# Patient Record
Sex: Male | Born: 1975 | Race: White | Hispanic: No | Marital: Single | Smoking: Former smoker
Health system: Southern US, Community
[De-identification: ages and names within clinical notes are randomized; demographics above are authoritative.]

## PROBLEM LIST (undated history)

## (undated) DIAGNOSIS — F209 Schizophrenia, unspecified: Secondary | ICD-10-CM

## (undated) DIAGNOSIS — J189 Pneumonia, unspecified organism: Secondary | ICD-10-CM

## (undated) HISTORY — PX: NO PAST SURGERIES: SHX2092

---

## 1997-05-15 ENCOUNTER — Encounter: Admission: RE | Admit: 1997-05-15 | Discharge: 1997-05-15 | Payer: Self-pay | Admitting: Internal Medicine

## 2002-07-27 ENCOUNTER — Inpatient Hospital Stay (HOSPITAL_COMMUNITY): Admission: EM | Admit: 2002-07-27 | Discharge: 2002-08-01 | Payer: Self-pay | Admitting: Psychiatry

## 2003-10-29 ENCOUNTER — Emergency Department (HOSPITAL_COMMUNITY): Admission: EM | Admit: 2003-10-29 | Discharge: 2003-10-29 | Payer: Self-pay | Admitting: Emergency Medicine

## 2004-03-02 ENCOUNTER — Inpatient Hospital Stay (HOSPITAL_COMMUNITY): Admission: RE | Admit: 2004-03-02 | Discharge: 2004-03-08 | Payer: Self-pay | Admitting: Psychiatry

## 2004-03-02 ENCOUNTER — Ambulatory Visit: Payer: Self-pay | Admitting: Psychiatry

## 2004-03-06 ENCOUNTER — Ambulatory Visit (HOSPITAL_COMMUNITY): Admission: RE | Admit: 2004-03-06 | Discharge: 2004-03-06 | Payer: Self-pay | Admitting: Family Medicine

## 2004-05-17 ENCOUNTER — Inpatient Hospital Stay (HOSPITAL_COMMUNITY): Admission: EM | Admit: 2004-05-17 | Discharge: 2004-05-20 | Payer: Self-pay | Admitting: Emergency Medicine

## 2004-09-29 ENCOUNTER — Emergency Department (HOSPITAL_COMMUNITY): Admission: EM | Admit: 2004-09-29 | Discharge: 2004-09-29 | Payer: Self-pay | Admitting: Emergency Medicine

## 2010-01-27 ENCOUNTER — Encounter: Payer: Self-pay | Admitting: Psychiatry

## 2013-03-29 DIAGNOSIS — F331 Major depressive disorder, recurrent, moderate: Secondary | ICD-10-CM | POA: Diagnosis not present

## 2013-04-06 DIAGNOSIS — F3289 Other specified depressive episodes: Secondary | ICD-10-CM | POA: Diagnosis not present

## 2013-04-06 DIAGNOSIS — F329 Major depressive disorder, single episode, unspecified: Secondary | ICD-10-CM | POA: Diagnosis not present

## 2013-05-04 DIAGNOSIS — F3289 Other specified depressive episodes: Secondary | ICD-10-CM | POA: Diagnosis not present

## 2013-05-04 DIAGNOSIS — F329 Major depressive disorder, single episode, unspecified: Secondary | ICD-10-CM | POA: Diagnosis not present

## 2014-06-28 ENCOUNTER — Encounter: Payer: Self-pay | Admitting: *Deleted

## 2014-06-28 NOTE — Progress Notes (Signed)
Oncology Nurse Navigator Documentation  Oncology Nurse Navigator Flowsheets 06/28/2014  Navigator Encounter Type Other/Phone call   Treatment Phase Abnormal Scans/Patient called and had some concerns about his treatment. He is coming 07/06/14 for a second opinion.  I called back and spoke with his wife.  I helped educate her on SCLC.  She had some general questions.  I answered all questions and concerns .    Time Spent with Patient 30

## 2015-01-18 ENCOUNTER — Encounter (HOSPITAL_COMMUNITY): Payer: Self-pay | Admitting: Emergency Medicine

## 2015-01-18 ENCOUNTER — Emergency Department (INDEPENDENT_AMBULATORY_CARE_PROVIDER_SITE_OTHER)
Admission: EM | Admit: 2015-01-18 | Discharge: 2015-01-18 | Disposition: A | Discharge status: Home / Self Care | Payer: Medicare Other | Source: Home / Self Care | Attending: Family Medicine | Admitting: Family Medicine

## 2015-01-18 DIAGNOSIS — J9801 Acute bronchospasm: Secondary | ICD-10-CM | POA: Diagnosis not present

## 2015-01-18 DIAGNOSIS — J069 Acute upper respiratory infection, unspecified: Secondary | ICD-10-CM | POA: Diagnosis not present

## 2015-01-18 DIAGNOSIS — Z720 Tobacco use: Secondary | ICD-10-CM | POA: Diagnosis not present

## 2015-01-18 MED ORDER — ALBUTEROL SULFATE HFA 108 (90 BASE) MCG/ACT IN AERS: 2.0000 | INHALATION_SPRAY | RESPIRATORY_TRACT | Status: DC | PRN

## 2015-01-18 MED ORDER — PREDNISONE 20 MG PO TABS: ORAL_TABLET | ORAL | Status: DC

## 2015-01-18 NOTE — ED Provider Notes (Signed)
CSN: [id number]     Arrival date & time 01/18/15  1832 History   First MD Initiated Contact with Patient 01/18/15 1932     No chief complaint on file.  (Consider location/radiation/quality/duration/timing/severity/associated sxs/prior Treatment) HPI Comments: 40 year old male complaining of nasal congestion and runny nose and may be a cough. He does not remember. Denies PND, earache or fever. He smokes. He cannot remember how much.   No past medical history on file. No past surgical history on file. No family history on file. Social History  Substance Use Topics  . Smoking status: Not on file  . Smokeless tobacco: Not on file  . Alcohol Use: Not on file    Review of Systems  Constitutional: Negative for fever, diaphoresis, activity change and fatigue.  HENT: Positive for congestion, rhinorrhea and voice change. Negative for ear pain, facial swelling, postnasal drip, sore throat and trouble swallowing.   Eyes: Negative for pain, discharge and redness.  Respiratory: Positive for cough. Negative for chest tightness and shortness of breath.   Cardiovascular: Negative.   Gastrointestinal: Negative.   Musculoskeletal: Negative.  Negative for neck pain and neck stiffness.  Neurological: Negative.   All other systems reviewed and are negative.   Allergies  Review of patient's allergies indicates not on file.  Home Medications   Prior to Admission medications   Medication Sig Start Date End Date Taking? Authorizing Provider  albuterol (PROVENTIL HFA;VENTOLIN HFA) 108 (90 Base) MCG/ACT inhaler Inhale 2 puffs into the lungs every 4 (four) hours as needed for wheezing or shortness of breath. 01/18/15   Hayden Rasmussen, NP  predniSONE (DELTASONE) 20 MG tablet Take 3 tabs po on first day, 2 tabs second day, 2 tabs third day, 1 tab fourth day, 1 tab 5th day. Take with food. 01/18/15   Hayden Rasmussen, NP   Meds Ordered and Administered this Visit  Medications - No data to display  BP 148/89 mmHg   Pulse 103  Temp(Src) 97.8 F (36.6 C) (Oral)  Resp 16  SpO2 99% No data found.   Physical Exam  Constitutional: He is oriented to person, place, and time. He appears well-developed and well-nourished. No distress.  HENT:  Mouth/Throat: No oropharyngeal exudate.  Bilateral TMs are occluded by cerumen and debris. No drainage or moisture. Oropharynx with copious amount of clear PND, minor erythema and cobblestoning.  Eyes: Conjunctivae and EOM are normal.  Neck: Normal range of motion. Neck supple.  Cardiovascular: Normal rate, regular rhythm and normal heart sounds.   Pulmonary/Chest: Effort normal. No respiratory distress. He has wheezes.  Musculoskeletal: Normal range of motion. He exhibits no edema.  Lymphadenopathy:    He has no cervical adenopathy.  Neurological: He is alert and oriented to person, place, and time.  Skin: Skin is warm and dry. No rash noted.  Psychiatric: He has a normal mood and affect.  Nursing note and vitals reviewed.   ED Course  Procedures (including critical care time)  Labs Review Labs Reviewed - No data to display  Imaging Review No results found.   Visual Acuity Review  Right Eye Distance:   Left Eye Distance:   Bilateral Distance:    Right Eye Near:   Left Eye Near:    Bilateral Near:         MDM   1. URI (upper respiratory infection)   2. Tobacco abuse disorder   3. Bronchospasm    For drainage may take Zyrtec, Claritin or Allegra. For congestion take Sudafed PE 10 mg  every 4-6 hours as needed. Use plenty of saline nasal spray. Tylenol every 4 hours as needed for discomfort. Stop smoking. Prednisone taper dose Albuterol HFA 2 puffs every 4 hours for wheezing and cough Instructions on how to use an inhaler.    Hayden Rasmussenavid Alondria Mousseau, NP 01/18/15 2005

## 2015-01-18 NOTE — Discharge Instructions (Signed)
Bronchospasm, Adult A bronchospasm is a spasm or tightening of the airways going into the lungs. During a bronchospasm breathing becomes more difficult because the airways get smaller. When this happens there can be coughing, a whistling sound when breathing (wheezing), and difficulty breathing. Bronchospasm is often associated with asthma, but not all patients who experience a bronchospasm have asthma. CAUSES  A bronchospasm is caused by inflammation or irritation of the airways. The inflammation or irritation may be triggered by:   Allergies (such as to animals, pollen, food, or mold). Allergens that cause bronchospasm may cause wheezing immediately after exposure or many hours later.   Infection. Viral infections are believed to be the most common cause of bronchospasm.   Exercise.   Irritants (such as pollution, cigarette smoke, strong odors, aerosol sprays, and paint fumes).   Weather changes. Winds increase molds and pollens in the air. Rain refreshes the air by washing irritants out. Cold air may cause inflammation.   Stress and emotional upset.  SIGNS AND SYMPTOMS   Wheezing.   Excessive nighttime coughing.   Frequent or severe coughing with a simple cold.   Chest tightness.   Shortness of breath.  DIAGNOSIS  Bronchospasm is usually diagnosed through a history and physical exam. Tests, such as chest X-rays, are sometimes done to look for other conditions. TREATMENT   Inhaled medicines can be given to open up your airways and help you breathe. The medicines can be given using either an inhaler or a nebulizer machine.  Corticosteroid medicines may be given for severe bronchospasm, usually when it is associated with asthma. HOME CARE INSTRUCTIONS   Always have a plan prepared for seeking medical care. Know when to call your health care provider and local emergency services (911 in the [place] Know where you can access local emergency care.  Only take medicines as  directed by your health care provider.  If you were prescribed an inhaler or nebulizer machine, ask your health care provider to explain how to use it correctly. Always use a spacer with your inhaler if you were given one.  It is necessary to remain calm during an attack. Try to relax and breathe more slowly.  Control your home environment in the following ways:   Change your heating and air conditioning filter at least once a month.   Limit your use of fireplaces and wood stoves.  Do not smoke and do not allow smoking in your home.   Avoid exposure to perfumes and fragrances.   Get rid of pests (such as roaches and mice) and their droppings.   Throw away plants if you see mold on them.   Keep your house clean and dust free.   Replace carpet with wood, tile, or vinyl flooring. Carpet can trap dander and dust.   Use allergy-proof pillows, mattress covers, and box spring covers.   Wash bed sheets and blankets every week in hot water and dry them in a dryer.   Use blankets that are made of polyester or cotton.   Wash hands frequently. SEEK MEDICAL CARE IF:   You have muscle aches.   You have chest pain.   The sputum changes from clear or white to yellow, green, gray, or bloody.   The sputum you cough up gets thicker.   There are problems that may be related to the medicine you are given, such as a rash, itching, swelling, or trouble breathing.  SEEK IMMEDIATE MEDICAL CARE IF:   You have worsening wheezing and coughing  even after taking your prescribed medicines.   You have increased difficulty breathing.   You develop severe chest pain. MAKE SURE YOU:   Understand these instructions.  Will watch your condition.  Will get help right away if you are not doing well or get worse.   This information is not intended to replace advice given to you by your health care provider. Make sure you discuss any questions you have with your health care  provider.   Document Released: 12/26/2002 Document Revised: 01/13/2014 Document Reviewed: 06/14/2012 Elsevier Interactive Patient Education 2016 [employer].  How to Use an Inhaler Using your inhaler correctly is very important. Good technique will make sure that the medicine reaches your lungs.  HOW TO USE AN INHALER:  Take the cap off the inhaler.  If this is the first time using your inhaler, you need to prime it. Shake the inhaler for 5 seconds. Release four puffs into the air, away from your face. Ask your doctor for help if you have questions.  Shake the inhaler for 5 seconds.  Turn the inhaler so the bottle is above the mouthpiece.  Put your pointer finger on top of the bottle. Your thumb holds the bottom of the inhaler.  Open your mouth.  Either hold the inhaler away from your mouth (the width of 2 fingers) or place your lips tightly around the mouthpiece. Ask your doctor which way to use your inhaler.  Breathe out as much air as possible.  Breathe in and push down on the bottle 1 time to release the medicine. You will feel the medicine go in your mouth and throat.  Continue to take a deep breath in very slowly. Try to fill your lungs.  After you have breathed in completely, hold your breath for 10 seconds. This will help the medicine to settle in your lungs. If you cannot hold your breath for 10 seconds, hold it for as long as you can before you breathe out.  Breathe out slowly, through pursed lips. Whistling is an example of pursed lips.  If your doctor has told you to take more than 1 puff, wait at least 15-30 seconds between puffs. This will help you get the best results from your medicine. Do not use the inhaler more than your doctor tells you to.  Put the cap back on the inhaler.  Follow the directions from your doctor or from the inhaler package about cleaning the inhaler. If you use more than one inhaler, ask your doctor which inhalers to use and what order to  use them in. Ask your doctor to help you figure out when you will need to refill your inhaler.  If you use a steroid inhaler, always rinse your mouth with water after your last puff, gargle and spit out the water. Do not swallow the water. GET HELP IF:  The inhaler medicine only partially helps to stop wheezing or shortness of breath.  You are having trouble using your inhaler.  You have some increase in thick spit (phlegm). GET HELP RIGHT AWAY IF:  The inhaler medicine does not help your wheezing or shortness of breath or you have tightness in your chest.  You have dizziness, headaches, or fast heart rate.  You have chills, fever, or night sweats.  You have a large increase of thick spit, or your thick spit is bloody. MAKE SURE YOU:   Understand these instructions.  Will watch your condition.  Will get help right away if you are not  doing well or get worse.   This information is not intended to replace advice given to you by your health care provider. Make sure you discuss any questions you have with your health care provider.   Document Released: 10/02/2007 Document Revised: 10/13/2012 Document Reviewed: 07/22/2012 Elsevier Interactive Patient Education 2016 [employer] Inc[employer]  Upper Respiratory Infection, Adult For drainage may take Zyrtec, Claritin or Allegra. For congestion take Sudafed PE 10 mg every 4-6 hours as needed. Use plenty of saline nasal spray. Tylenol every 4 hours as needed for discomfort. Stop smoking. Most upper respiratory infections (URIs) are a viral infection of the air passages leading to the lungs. A URI affects the nose, throat, and upper air passages. The most common type of URI is nasopharyngitis and is typically referred to as "the common cold." URIs run their course and usually go away on their own. Most of the time, a URI does not require medical attention, but sometimes a bacterial infection in the upper airways can follow a viral infection. This is  called a secondary infection. Sinus and middle ear infections are common types of secondary upper respiratory infections. Bacterial pneumonia can also complicate a URI. A URI can worsen asthma and chronic obstructive pulmonary disease (COPD). Sometimes, these complications can require emergency medical care and may be life threatening.  CAUSES Almost all URIs are caused by viruses. A virus is a type of germ and can spread from one person to another.  RISKS FACTORS You may be at risk for a URI if:   You smoke.   You have chronic heart or lung disease.  You have a weakened defense (immune) system.   You are very young or very old.   You have nasal allergies or asthma.  You work in crowded or poorly ventilated areas.  You work in health care facilities or schools. SIGNS AND SYMPTOMS  Symptoms typically develop 2-3 days after you come in contact with a cold virus. Most viral URIs last 7-10 days. However, viral URIs from the influenza virus (flu virus) can last 14-18 days and are typically more severe. Symptoms may include:   Runny or stuffy (congested) nose.   Sneezing.   Cough.   Sore throat.   Headache.   Fatigue.   Fever.   Loss of appetite.   Pain in your forehead, behind your eyes, and over your cheekbones (sinus pain).  Muscle aches.  DIAGNOSIS  Your health care provider may diagnose a URI by:  Physical exam.  Tests to check that your symptoms are not due to another condition such as:  Strep throat.  Sinusitis.  Pneumonia.  Asthma. TREATMENT  A URI goes away on its own with time. It cannot be cured with medicines, but medicines may be prescribed or recommended to relieve symptoms. Medicines may help:  Reduce your fever.  Reduce your cough.  Relieve nasal congestion. HOME CARE INSTRUCTIONS   Take medicines only as directed by your health care provider.   Gargle warm saltwater or take cough drops to comfort your throat as directed by  your health care provider.  Use a warm mist humidifier or inhale steam from a shower to increase air moisture. This may make it easier to breathe.  Drink enough fluid to keep your urine clear or pale yellow.   Eat soups and other clear broths and maintain good nutrition.   Rest as needed.   Return to work when your temperature has returned to normal or as your health care provider advises.  You may need to stay home longer to avoid infecting others. You can also use a face mask and careful hand washing to prevent spread of the virus.  Increase the usage of your inhaler if you have asthma.   Do not use any tobacco products, including cigarettes, chewing tobacco, or electronic cigarettes. If you need help quitting, ask your health care provider. PREVENTION  The best way to protect yourself from getting a cold is to practice good hygiene.   Avoid oral or hand contact with people with cold symptoms.   Wash your hands often if contact occurs.  There is no clear evidence that vitamin C, vitamin E, echinacea, or exercise reduces the chance of developing a cold. However, it is always recommended to get plenty of rest, exercise, and practice good nutrition.  SEEK MEDICAL CARE IF:   You are getting worse rather than better.   Your symptoms are not controlled by medicine.   You have chills.  You have worsening shortness of breath.  You have brown or red mucus.  You have yellow or brown nasal discharge.  You have pain in your face, especially when you bend forward.  You have a fever.  You have swollen neck glands.  You have pain while swallowing.  You have white areas in the back of your throat. SEEK IMMEDIATE MEDICAL CARE IF:   You have severe or persistent:  Headache.  Ear pain.  Sinus pain.  Chest pain.  You have chronic lung disease and any of the following:  Wheezing.  Prolonged cough.  Coughing up blood.  A change in your usual mucus.  You have a  stiff neck.  You have changes in your:  Vision.  Hearing.  Thinking.  Mood. MAKE SURE YOU:   Understand these instructions.  Will watch your condition.  Will get help right away if you are not doing well or get worse.   This information is not intended to replace advice given to you by your health care provider. Make sure you discuss any questions you have with your health care provider.   Document Released: 06/18/2000 Document Revised: 05/09/2014 Document Reviewed: 03/30/2013 Elsevier Interactive Patient Education 2016 [employer]  Smoking Hazards Smoking cigarettes is extremely bad for your health. Tobacco smoke has over 200 known poisons in it. It contains the poisonous gases nitrogen oxide and carbon monoxide. There are over 60 chemicals in tobacco smoke that cause cancer. Some of the chemicals found in cigarette smoke include:   Cyanide.   Benzene.   Formaldehyde.   Methanol (wood alcohol).   Acetylene (fuel used in welding torches).   Ammonia.  Even smoking lightly shortens your life expectancy by several years. You can greatly reduce the risk of medical problems for you and your family by stopping now. Smoking is the most preventable cause of death and disease in our society. Within days of quitting smoking, your circulation improves, you decrease the risk of having a heart attack, and your lung capacity improves. There may be some increased phlegm in the first few days after quitting, and it may take months for your lungs to clear up completely. Quitting for 10 years reduces your risk of developing lung cancer to almost that of a nonsmoker.  WHAT ARE THE RISKS OF SMOKING? Cigarette smokers have an increased risk of many serious medical problems, including:  Lung cancer.   Lung disease (such as pneumonia, bronchitis, and emphysema).   Heart attack and chest pain due to the heart not  getting enough oxygen (angina).   Heart disease and peripheral blood  vessel disease.   Hypertension.   Stroke.   Oral cancer (cancer of the lip, mouth, or voice box).   Bladder cancer.   Pancreatic cancer.   Cervical cancer.   Pregnancy complications, including premature birth.   Stillbirths and smaller newborn babies, birth defects, and genetic damage to sperm.   Early menopause.   Lower estrogen level for women.   Infertility.   Facial wrinkles.   Blindness.   Increased risk of broken bones (fractures).   Senile dementia.   Stomach ulcers and internal bleeding.   Delayed wound healing and increased risk of complications during surgery. Because of secondhand smoke exposure, children of smokers have an increased risk of the following:   Sudden infant death syndrome (SIDS).   Respiratory infections.   Lung cancer.   Heart disease.   Ear infections.  WHY IS SMOKING ADDICTIVE? Nicotine is the chemical agent in tobacco that is capable of causing addiction or dependence. When you smoke and inhale, nicotine is absorbed rapidly into the bloodstream through your lungs. Both inhaled and noninhaled nicotine may be addictive.  WHAT ARE THE BENEFITS OF QUITTING?  There are many health benefits to quitting smoking. Some are:   The likelihood of developing cancer and heart disease decreases. Health improvements are seen almost immediately.   Blood pressure, pulse rate, and breathing patterns start returning to normal soon after quitting.   People who quit may see an improvement in their overall quality of life.  HOW DO YOU QUIT SMOKING? Smoking is an addiction with both physical and psychological effects, and longtime habits can be hard to change. Your health care provider can recommend:  Programs and community resources, which may include group support, education, or therapy.  Replacement products, such as patches, gum, and nasal sprays. Use these products only as directed. Do not replace cigarette smoking with  electronic cigarettes (commonly called e-cigarettes). The safety of e-cigarettes is unknown, and some may contain harmful chemicals. FOR MORE INFORMATION  American Lung Association: www.lung.org  American Cancer Society: www.cancer.org   This information is not intended to replace advice given to you by your health care provider. Make sure you discuss any questions you have with your health care provider.   Document Released: 01/31/2004 Document Revised: 10/13/2012 Document Reviewed: 06/14/2012 Elsevier Interactive Patient Education [employer]

## 2018-01-08 ENCOUNTER — Encounter (HOSPITAL_COMMUNITY): Payer: Self-pay | Admitting: *Deleted

## 2018-01-08 ENCOUNTER — Emergency Department (HOSPITAL_COMMUNITY): Payer: Medicare Other

## 2018-01-08 ENCOUNTER — Other Ambulatory Visit: Payer: Self-pay

## 2018-01-08 ENCOUNTER — Inpatient Hospital Stay (HOSPITAL_COMMUNITY)
Admission: EM | Admit: 2018-01-08 | Discharge: 2018-01-19 | DRG: 086 | Disposition: A | Discharge status: Home Health | Payer: Medicare Other | Attending: General Surgery | Admitting: General Surgery

## 2018-01-08 DIAGNOSIS — S065XAA Traumatic subdural hemorrhage with loss of consciousness status unknown, initial encounter: Secondary | ICD-10-CM

## 2018-01-08 DIAGNOSIS — S065X9A Traumatic subdural hemorrhage with loss of consciousness of unspecified duration, initial encounter: Secondary | ICD-10-CM | POA: Diagnosis present

## 2018-01-08 DIAGNOSIS — S82892A Other fracture of left lower leg, initial encounter for closed fracture: Secondary | ICD-10-CM

## 2018-01-08 DIAGNOSIS — R52 Pain, unspecified: Secondary | ICD-10-CM

## 2018-01-08 DIAGNOSIS — S065X0A Traumatic subdural hemorrhage without loss of consciousness, initial encounter: Principal | ICD-10-CM | POA: Diagnosis present

## 2018-01-08 DIAGNOSIS — Y92410 Unspecified street and highway as the place of occurrence of the external cause: Secondary | ICD-10-CM

## 2018-01-08 DIAGNOSIS — F209 Schizophrenia, unspecified: Secondary | ICD-10-CM | POA: Diagnosis present

## 2018-01-08 DIAGNOSIS — Z9114 Patient's other noncompliance with medication regimen: Secondary | ICD-10-CM

## 2018-01-08 DIAGNOSIS — E876 Hypokalemia: Secondary | ICD-10-CM | POA: Diagnosis present

## 2018-01-08 DIAGNOSIS — S8262XA Displaced fracture of lateral malleolus of left fibula, initial encounter for closed fracture: Secondary | ICD-10-CM

## 2018-01-08 DIAGNOSIS — R51 Headache: Secondary | ICD-10-CM | POA: Diagnosis not present

## 2018-01-08 DIAGNOSIS — F17210 Nicotine dependence, cigarettes, uncomplicated: Secondary | ICD-10-CM | POA: Diagnosis present

## 2018-01-08 DIAGNOSIS — S82831A Other fracture of upper and lower end of right fibula, initial encounter for closed fracture: Secondary | ICD-10-CM

## 2018-01-08 DIAGNOSIS — S82832A Other fracture of upper and lower end of left fibula, initial encounter for closed fracture: Secondary | ICD-10-CM

## 2018-01-08 DIAGNOSIS — S2242XA Multiple fractures of ribs, left side, initial encounter for closed fracture: Secondary | ICD-10-CM

## 2018-01-08 DIAGNOSIS — T1490XA Injury, unspecified, initial encounter: Secondary | ICD-10-CM

## 2018-01-08 DIAGNOSIS — S8255XA Nondisplaced fracture of medial malleolus of left tibia, initial encounter for closed fracture: Secondary | ICD-10-CM | POA: Diagnosis present

## 2018-01-08 DIAGNOSIS — Z008 Encounter for other general examination: Secondary | ICD-10-CM

## 2018-01-08 HISTORY — DX: Schizophrenia, unspecified: F20.9

## 2018-01-08 LAB — COMPREHENSIVE METABOLIC PANEL
ALK PHOS: 55 U/L (ref 38–126)
ALT: 24 U/L (ref 0–44)
AST: 35 U/L (ref 15–41)
Albumin: 3.9 g/dL (ref 3.5–5.0)
Anion gap: 6 (ref 5–15)
BUN: 7 mg/dL (ref 6–20)
CALCIUM: 8.7 mg/dL — AB (ref 8.9–10.3)
CO2: 24 mmol/L (ref 22–32)
Chloride: 108 mmol/L (ref 98–111)
Creatinine, Ser: 1.06 mg/dL (ref 0.61–1.24)
GFR calc Af Amer: >60 mL/min (ref >60)
GFR calc non Af Amer: >60 mL/min (ref >60)
Glucose, Bld: 137 mg/dL — ABNORMAL HIGH (ref 70–99)
Potassium: 3.2 mmol/L — ABNORMAL LOW (ref 3.5–5.1)
Sodium: 138 mmol/L (ref 135–145)
TOTAL PROTEIN: 6 g/dL — AB (ref 6.5–8.1)
Total Bilirubin: 1.1 mg/dL (ref 0.3–1.2)

## 2018-01-08 LAB — CBC
HCT: 44 % (ref 39.0–52.0)
Hemoglobin: 14.3 g/dL (ref 13.0–17.0)
MCH: 31 pg (ref 26.0–34.0)
MCHC: 32.5 g/dL (ref 30.0–36.0)
MCV: 95.2 fL (ref 80.0–100.0)
Platelets: 294 10*3/uL (ref 150–400)
RBC: 4.62 MIL/uL (ref 4.22–5.81)
RDW: 13.3 % (ref 11.5–15.5)
WBC: 11.9 10*3/uL — ABNORMAL HIGH (ref 4.0–10.5)
nRBC: 0 % (ref 0.0–0.2)

## 2018-01-08 LAB — I-STAT CHEM 8, ED
BUN: 8 mg/dL (ref 6–20)
Calcium, Ion: 1.08 mmol/L — ABNORMAL LOW (ref 1.15–1.40)
Chloride: 105 mmol/L (ref 98–111)
Creatinine, Ser: 0.9 mg/dL (ref 0.61–1.24)
Glucose, Bld: 128 mg/dL — ABNORMAL HIGH (ref 70–99)
HCT: 43 % (ref 39.0–52.0)
Hemoglobin: 14.6 g/dL (ref 13.0–17.0)
POTASSIUM: 3.2 mmol/L — AB (ref 3.5–5.1)
Sodium: 139 mmol/L (ref 135–145)
TCO2: 23 mmol/L (ref 22–32)

## 2018-01-08 LAB — PROTIME-INR
INR: 1.05
Prothrombin Time: 13.6 seconds (ref 11.4–15.2)

## 2018-01-08 LAB — SAMPLE TO BLOOD BANK

## 2018-01-08 LAB — ETHANOL: Alcohol, Ethyl (B): <10 mg/dL (ref <10)

## 2018-01-08 LAB — I-STAT CG4 LACTIC ACID, ED: Lactic Acid, Venous: 2.6 mmol/L (ref 0.5–1.9)

## 2018-01-08 LAB — CDS SEROLOGY

## 2018-01-08 MED ORDER — FENTANYL CITRATE (PF) 100 MCG/2ML IJ SOLN
50.0000 ug | Freq: Once | INTRAMUSCULAR | Status: AC
  Administered 2018-01-08: 50 ug via INTRAVENOUS
  Filled 2018-01-08: qty 2

## 2018-01-08 MED ORDER — IOHEXOL 300 MG/ML  SOLN
100.0000 mL | Freq: Once | INTRAMUSCULAR | Status: AC | PRN
  Administered 2018-01-08: 100 mL via INTRAVENOUS

## 2018-01-08 NOTE — ED Notes (Signed)
Pt taken to xray 
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
## 2018-01-08 NOTE — ED Notes (Signed)
Pt lying on R side for comfort
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
## 2018-01-08 NOTE — ED Notes (Signed)
Pt taken to CT.
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
## 2018-01-09 ENCOUNTER — Emergency Department (HOSPITAL_COMMUNITY): Payer: Medicare Other

## 2018-01-09 ENCOUNTER — Other Ambulatory Visit: Payer: Self-pay

## 2018-01-09 ENCOUNTER — Encounter (HOSPITAL_COMMUNITY): Payer: Self-pay

## 2018-01-09 ENCOUNTER — Inpatient Hospital Stay (HOSPITAL_COMMUNITY): Payer: Medicare Other

## 2018-01-09 DIAGNOSIS — Z9114 Patient's other noncompliance with medication regimen: Secondary | ICD-10-CM | POA: Diagnosis not present

## 2018-01-09 DIAGNOSIS — R51 Headache: Secondary | ICD-10-CM | POA: Diagnosis present

## 2018-01-09 DIAGNOSIS — F17210 Nicotine dependence, cigarettes, uncomplicated: Secondary | ICD-10-CM | POA: Diagnosis present

## 2018-01-09 DIAGNOSIS — R609 Edema, unspecified: Secondary | ICD-10-CM | POA: Diagnosis not present

## 2018-01-09 DIAGNOSIS — S82831A Other fracture of upper and lower end of right fibula, initial encounter for closed fracture: Secondary | ICD-10-CM | POA: Diagnosis present

## 2018-01-09 DIAGNOSIS — Z008 Encounter for other general examination: Secondary | ICD-10-CM | POA: Diagnosis not present

## 2018-01-09 DIAGNOSIS — S065X9A Traumatic subdural hemorrhage with loss of consciousness of unspecified duration, initial encounter: Secondary | ICD-10-CM | POA: Diagnosis present

## 2018-01-09 DIAGNOSIS — S2242XA Multiple fractures of ribs, left side, initial encounter for closed fracture: Secondary | ICD-10-CM | POA: Diagnosis present

## 2018-01-09 DIAGNOSIS — S065X0A Traumatic subdural hemorrhage without loss of consciousness, initial encounter: Secondary | ICD-10-CM | POA: Diagnosis present

## 2018-01-09 DIAGNOSIS — S82832A Other fracture of upper and lower end of left fibula, initial encounter for closed fracture: Secondary | ICD-10-CM | POA: Diagnosis present

## 2018-01-09 DIAGNOSIS — Y92410 Unspecified street and highway as the place of occurrence of the external cause: Secondary | ICD-10-CM | POA: Diagnosis not present

## 2018-01-09 DIAGNOSIS — F209 Schizophrenia, unspecified: Secondary | ICD-10-CM | POA: Diagnosis present

## 2018-01-09 DIAGNOSIS — S065XAA Traumatic subdural hemorrhage with loss of consciousness status unknown, initial encounter: Secondary | ICD-10-CM | POA: Diagnosis present

## 2018-01-09 DIAGNOSIS — S8255XA Nondisplaced fracture of medial malleolus of left tibia, initial encounter for closed fracture: Secondary | ICD-10-CM | POA: Diagnosis present

## 2018-01-09 DIAGNOSIS — E876 Hypokalemia: Secondary | ICD-10-CM | POA: Diagnosis present

## 2018-01-09 LAB — COMPREHENSIVE METABOLIC PANEL
ALT: 23 U/L (ref 0–44)
ANION GAP: 8 (ref 5–15)
AST: 37 U/L (ref 15–41)
Albumin: 3.7 g/dL (ref 3.5–5.0)
Alkaline Phosphatase: 51 U/L (ref 38–126)
BUN: 6 mg/dL (ref 6–20)
CHLORIDE: 109 mmol/L (ref 98–111)
CO2: 21 mmol/L — ABNORMAL LOW (ref 22–32)
Calcium: 8.6 mg/dL — ABNORMAL LOW (ref 8.9–10.3)
Creatinine, Ser: 0.85 mg/dL (ref 0.61–1.24)
GFR calc non Af Amer: >60 mL/min (ref >60)
Glucose, Bld: 121 mg/dL — ABNORMAL HIGH (ref 70–99)
Potassium: 3.7 mmol/L (ref 3.5–5.1)
SODIUM: 138 mmol/L (ref 135–145)
Total Bilirubin: 1 mg/dL (ref 0.3–1.2)
Total Protein: 5.8 g/dL — ABNORMAL LOW (ref 6.5–8.1)

## 2018-01-09 LAB — CBC
HCT: 40.3 % (ref 39.0–52.0)
Hemoglobin: 13.3 g/dL (ref 13.0–17.0)
MCH: 30.8 pg (ref 26.0–34.0)
MCHC: 33 g/dL (ref 30.0–36.0)
MCV: 93.3 fL (ref 80.0–100.0)
Platelets: 245 10*3/uL (ref 150–400)
RBC: 4.32 MIL/uL (ref 4.22–5.81)
RDW: 13.6 % (ref 11.5–15.5)
WBC: 15.5 10*3/uL — AB (ref 4.0–10.5)
nRBC: 0 % (ref 0.0–0.2)

## 2018-01-09 LAB — HIV ANTIBODY (ROUTINE TESTING W REFLEX): HIV Screen 4th Generation wRfx: NONREACTIVE

## 2018-01-09 LAB — MRSA PCR SCREENING: MRSA by PCR: NEGATIVE

## 2018-01-09 MED ORDER — OXYCODONE HCL 5 MG PO TABS
5.0000 mg | ORAL_TABLET | ORAL | Status: DC | PRN
  Administered 2018-01-11 – 2018-01-12 (×2): 5 mg via ORAL
  Filled 2018-01-09 (×3): qty 1

## 2018-01-09 MED ORDER — PANTOPRAZOLE SODIUM 40 MG PO TBEC
40.0000 mg | DELAYED_RELEASE_TABLET | Freq: Every day | ORAL | Status: DC
  Administered 2018-01-09 – 2018-01-19 (×11): 40 mg via ORAL
  Filled 2018-01-09 (×11): qty 1

## 2018-01-09 MED ORDER — PANTOPRAZOLE SODIUM 40 MG IV SOLR: 40.0000 mg | Freq: Every day | INTRAVENOUS | Status: DC

## 2018-01-09 MED ORDER — POTASSIUM CHLORIDE IN NACL 20-0.9 MEQ/L-% IV SOLN
INTRAVENOUS | Status: DC
  Administered 2018-01-09: 02:00:00 via INTRAVENOUS
  Filled 2018-01-09 (×2): qty 1000

## 2018-01-09 MED ORDER — ONDANSETRON HCL 4 MG/2ML IJ SOLN: 4.0000 mg | Freq: Four times a day (QID) | INTRAMUSCULAR | Status: DC | PRN

## 2018-01-09 MED ORDER — ONDANSETRON 4 MG PO TBDP: 4.0000 mg | ORAL_TABLET | Freq: Four times a day (QID) | ORAL | Status: DC | PRN

## 2018-01-09 MED ORDER — DOCUSATE SODIUM 100 MG PO CAPS
100.0000 mg | ORAL_CAPSULE | Freq: Two times a day (BID) | ORAL | Status: DC
  Administered 2018-01-09 – 2018-01-19 (×18): 100 mg via ORAL
  Filled 2018-01-09 (×19): qty 1

## 2018-01-09 MED ORDER — ACETAMINOPHEN 325 MG PO TABS: 650.0000 mg | ORAL_TABLET | ORAL | Status: DC | PRN

## 2018-01-09 MED ORDER — ENOXAPARIN SODIUM 40 MG/0.4ML ~~LOC~~ SOLN
40.0000 mg | SUBCUTANEOUS | Status: DC
  Administered 2018-01-10 – 2018-01-18 (×9): 40 mg via SUBCUTANEOUS
  Filled 2018-01-09 (×9): qty 0.4

## 2018-01-09 MED ORDER — OXYCODONE HCL 5 MG PO TABS
10.0000 mg | ORAL_TABLET | ORAL | Status: DC | PRN
  Administered 2018-01-09 – 2018-01-10 (×3): 10 mg via ORAL
  Filled 2018-01-09 (×3): qty 2

## 2018-01-09 MED ORDER — MORPHINE SULFATE (PF) 2 MG/ML IV SOLN: 1.0000 mg | INTRAVENOUS | Status: DC | PRN
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
## 2018-01-09 NOTE — Progress Notes (Signed)
Orthopedic Tech Progress Note Patient Details:  Tyler Mata March 01, 1975 [id number]  Ortho Devices Type of Ortho Device: CAM walker Ortho Device/Splint Interventions: Pierre Bali T 01/09/2018, 12:54 AM
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
## 2018-01-09 NOTE — Progress Notes (Signed)
Ortho did not order cam boot to right foot, LEFT FOOT ONLY
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
## 2018-01-09 NOTE — Progress Notes (Signed)
I have reviewed the dedicated left ankle x-rays.  He has no widening of the medial clear space and the mortise is congruent.  This will be appropriate for a trial of nonoperative management.  He is now able to weight-bear as tolerated in the fracture boot.  I would like to see him in the office in 1 week to 10 days for new weightbearing x-rays.

## 2018-01-10 LAB — CBC
HCT: 37.2 % — ABNORMAL LOW (ref 39.0–52.0)
Hemoglobin: 12 g/dL — ABNORMAL LOW (ref 13.0–17.0)
MCH: 30.6 pg (ref 26.0–34.0)
MCHC: 32.3 g/dL (ref 30.0–36.0)
MCV: 94.9 fL (ref 80.0–100.0)
Platelets: 211 10*3/uL (ref 150–400)
RBC: 3.92 MIL/uL — ABNORMAL LOW (ref 4.22–5.81)
RDW: 13.7 % (ref 11.5–15.5)
WBC: 12.8 10*3/uL — ABNORMAL HIGH (ref 4.0–10.5)
nRBC: 0 % (ref 0.0–0.2)
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
## 2018-01-11 MED ORDER — OXYCODONE HCL 5 MG PO TABS: 5.0000 mg | ORAL_TABLET | ORAL | 0 refills | Status: DC | PRN

## 2018-01-11 MED ORDER — ACETAMINOPHEN 325 MG PO TABS: 650.0000 mg | ORAL_TABLET | ORAL | Status: DC | PRN

## 2018-01-11 NOTE — Progress Notes (Signed)
CSW acknowledges consult. CSW completed workup and assessment on patient.   At this time the patient is declining SNF and would like to go home with home health services.   CSW referred the patient to [facility]. CSW also completed and SBIRT with the patient. Patient confirmed that he does not drink or use drugs. All has been documented.   No further needs at this time. CSW signing off.   If anything else is needed for the patient please don't hesitate to consult CSW again.   Drucilla Schmidt, MSW, LCSW-A Clinical Social Worker [employer]

## 2018-01-11 NOTE — Plan of Care (Signed)
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
## 2018-01-11 NOTE — Progress Notes (Addendum)
OT recommendation for SNF noted, will consult CSW for placement. Discharge on hold for now. Will notify patient.   Wells Guiles , [facility] Surgery 01/11/2018, 10:50 AM Pager: [phone] Mon-Fri 7:00 am-4:30 pm Sat-Sun 7:00 am-11:30 am
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
## 2018-01-11 NOTE — Progress Notes (Signed)
Spoke with mother Corrie Dandy on the phone she called for an update.  Informed her that patient was refusing a facility placement at this time and that he wanted to go home with home health.  Mother concerned that he will be unable to care for himself at home and requesting that patient have a psych consult before discharge to make sure he is able to make sound decisions.  Informed mother that I would let provider know.  MD paged awaiting response.
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
## 2018-01-12 DIAGNOSIS — Z008 Encounter for other general examination: Secondary | ICD-10-CM
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
## 2018-01-12 NOTE — Care Management Note (Signed)
Case Management Note  Patient Details  Name: Tyler Mata MRN: [id number] Date of Birth: 1975-07-14  Subjective/Objective:                    Action/Plan: Awaiting " Psychiatry eval for competency as he is refusing SNF If competent will plan D/C later today"  Spoke to patient at bedside. Patient wanting to go home at discharge with home health. Confirmed face sheet information with patient. Patient does not have a phone at present , home health can reach him through his mother [name] [phone].   Patient wanting walker and 3 in 1 for home. If cleared to go home can order and have DME delivered to hospital room before discharge. Patient does not have transportation home , will ask social work for cab voucher if cleared to go home.   Patient would like [facility] for home health services.  Expected Discharge Date:  01/11/18               Expected Discharge Plan:     In-House Referral:  Clinical Social Work  Discharge planning Services  CM Consult  Post Acute Care Choice:  Home Health, Durable Medical Equipment Choice offered to:  Patient  DME Arranged:  3-N-1, Walker rolling DME Agency:     HH Arranged:  PT, OT, Nurse's Aide HH Agency:     Status of Service:  In process, will continue to follow  If discussed at Long Length of Stay Meetings, dates discussed:    Additional Comments:  Kingsley Plan, RN 01/12/2018, 2:25 PM
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
## 2018-01-12 NOTE — Progress Notes (Signed)
Attempted to call SW to discuss Psychiatry's note but they have left for the day. Pt's mother, [name], called wanting an update on whether psych has seen pt and if the process of SNF placement would be started. I told pt's mother that SW has left for the day and they would be in touch tomorrow.
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
## 2018-01-14 ENCOUNTER — Inpatient Hospital Stay (HOSPITAL_COMMUNITY): Payer: Medicare Other

## 2018-01-14 NOTE — Progress Notes (Signed)
New xrays reviewed of left ankle.  Still with similar alignment of minimally displaced < 2 mm and appropriate for nonoperative management.  WBAT in fracture boot to LLE.  Will see in office in 2 weeks with follow up weight bearing xrays.
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
## 2018-01-15 NOTE — Progress Notes (Signed)
At this time; [facility] and [facility] have declined a bed for the patient.   CSW will continue to search for a placement.   Drucilla Schmidt, MSW, LCSW-A Clinical Social Worker [facility]

## 2018-01-15 NOTE — Care Management Important Message (Signed)
Important Message  Patient Details  Name: Tyler Mata MRN: [id number] Date of Birth: [birth year]   Medicare Important Message Given:  Yes    Ledora Delker 01/15/2018, 3:35 PM
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
## 2018-01-15 NOTE — Progress Notes (Signed)
Faxed out to more SNF placements. Possibly [facility] and [facility] could have a bed for the patient. Ch[facility]ell and Tammy will be getting back with me.   CSW will continue to follow.   Drucilla Schmidt, MSW, LCSW-A Clinical Social Worker [facility]
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
## 2018-01-15 NOTE — Plan of Care (Signed)

## 2018-01-16 LAB — CREATININE, SERUM
CREATININE: 0.78 mg/dL (ref 0.61–1.24)
GFR calc Af Amer: >60 mL/min (ref >60)
GFR calc non Af Amer: >60 mL/min (ref >60)
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
## 2018-01-16 NOTE — Clinical Social Work Note (Signed)
Clinical Social Worker continuing to follow patient and family for support and discharge planning needs.  Patient has been referred for Level 2 Pasarr - evaluator will come next week.  Patient still with no available bed offers.  CSW to expand search and follow up with patient family once bed available.  Tyler Mata, [place] [phone]
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
## 2018-01-18 NOTE — Clinical Social Work Note (Signed)
Clinical Social Worker continuing to follow patient and family for support and discharge planning needs.  CSW provided patient mother with available bed offers - patient family has chosen private room at [facility].  CSW spoke with Glass blower/designer who plans to visit with patient today for assessment.  CSW relayed to patient family that we are hopeful for PASARR number by tomorrow (1/14) and will facilitate discharge.  CSW has communicated with [facility] regarding patient potential discharge for tomorrow.  CSW remains available for support and to facilitate patient discharge needs.  Macario Golds, [place] [phone]

## 2018-01-19 ENCOUNTER — Inpatient Hospital Stay (HOSPITAL_COMMUNITY): Payer: Medicare Other

## 2018-01-19 DIAGNOSIS — R609 Edema, unspecified: Secondary | ICD-10-CM

## 2018-01-19 MED ORDER — DOCUSATE SODIUM 100 MG PO CAPS: 100.0000 mg | ORAL_CAPSULE | Freq: Every day | ORAL | 0 refills | Status: DC

## 2018-01-19 MED ORDER — ACETAMINOPHEN 325 MG PO TABS: 650.0000 mg | ORAL_TABLET | ORAL | Status: DC | PRN

## 2018-01-19 MED ORDER — OXYCODONE HCL 5 MG PO TABS: 5.0000 mg | ORAL_TABLET | Freq: Four times a day (QID) | ORAL | 0 refills | Status: DC | PRN

## 2018-01-19 NOTE — Social Work (Addendum)
PASRR received- [id number] F  Clinical Social Worker facilitated patient discharge including contacting patient family and facility to confirm patient discharge plans.  Clinical information faxed to facility and family agreeable with plan.  CSW arranged ambulance transport via [facility] to [place] [facility] [address] at 4:30pm.  RN to call [phone] with report prior to discharge.  Clinical Social Worker will sign off for now as social work intervention is no longer needed. Please consult [place] again if new need arises.  Octavio Graves, MSW, [facility] Clinical Social Worker [phone]

## 2018-01-19 NOTE — Clinical Social Work Placement (Signed)
   CLINICAL SOCIAL WORK PLACEMENT  NOTE RN to call report to [phone] [facility]  Date:  01/19/2018  Patient Details  Name: Tyler Mata MRN: [id number] Date of Birth: 1975/06/17  Clinical Social Work is seeking post-discharge placement for this patient at the Skilled  Nursing Facility level of care (*CSW will initial, date and re-position this form in  chart as items are completed):  Yes   Patient/family provided with [place] Clinical Social Work Department's list of facilities offering this level of care within the geographic area requested by the patient (or if unable, by the patient's family).  Yes   Patient/family informed of their freedom to choose among providers that offer the needed level of care, that participate in Medicare, Medicaid or managed care program needed by the patient, have an available bed and are willing to accept the patient.  Yes   Patient/family informed of [facility]'s ownership interest in [facility] and [facility], as well as of the fact that they are under no obligation to receive care at these facilities.  PASRR submitted to EDS on       PASRR number received on 01/19/18     Existing PASRR number confirmed on       FL2 transmitted to all facilities in geographic area requested by pt/family on 01/19/18     FL2 transmitted to all facilities within larger geographic area on       Patient informed that his/her managed care company has contracts with or will negotiate with certain facilities, including the following:        Yes   Patient/family informed of bed offers received.  Patient chooses bed at [facility]     Physician recommends and patient chooses bed at      Patient to be transferred to [facility] on 01/19/18.  Patient to be transferred to facility by [facility]     Patient family notified on 01/19/18 of transfer.  Name of family member notified:  pt mother [facility]      PHYSICIAN Please prepare priority discharge summary, including medications, Please prepare prescriptions     Additional Comment:    _______________________________________________ Doy Hutching, LCSWA 01/19/2018, 2:47 PM
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
## 2018-01-19 NOTE — Progress Notes (Addendum)
IV removed, [facility]  [facility] scheduled at 4:00 to transport.  Written prescription for Oxycodone will be sent with [facility].
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
## 2018-01-19 NOTE — Care Management Note (Signed)
Case Management Note Original note by: Kingsley Plan, RN 01/12/2018, 2:25 PM  Patient Details  Name: Tyler Mata MRN: [id number] Date of Birth: [birth year]  Subjective/Objective:                    Action/Plan: Awaiting " Psychiatry eval for competency as he is refusing SNF If competent will plan D/C later today"  Spoke to patient at bedside. Patient wanting to go home at discharge with home health. Confirmed face sheet information with patient. Patient does not have a phone at present , home health can reach him through his mother [name] [phone].   Patient wanting walker and 3 in 1 for home. If cleared to go home can order and have DME delivered to hospital room before discharge. Patient does not have transportation home , will ask social work for cab voucher if cleared to go home.   Patient would like [facility] for home health services.  Expected Discharge Date:  01/19/18               Expected Discharge Plan:  Skilled Nursing Facility  In-House Referral:  Clinical Social Work  Discharge planning Services  CM Consult  Post Acute Care Choice:    Choice offered to:     DME Arranged:    DME Agency:     HH Arranged:    HH Agency:     Status of Service:  Completed, signed off  If discussed at [employer], dates discussed:    Additional Comments:  01/14/2018 J. Astrid Drafts, RN BSN Per psych MD, pt lacks capacity to refuse SNF placement, as recommended by PT/OT.  CSW following to facilitate dc to SNF upon medical stability and bed available.   01/19/2018 J. Ling Flesch, RN, BSN Pt medically stable for discharge today and SNF bed available at [facility], per CSW arrangements.  PASRR number received.     Quintella Baton, RN, BSN  Trauma/Neuro ICU Case Manager [phone]

## 2018-01-19 NOTE — Discharge Instructions (Signed)
Walking Boot, Adult  A walking boot is a medical device that holds your foot or ankle in place after an injury or a medical procedure. This helps with healing and prevents further injury. A walking boot is a removable boot-shaped splint. It has a hard, rigid outer frame that limits movement and supports your foot and leg. The inner lining is a layer of padded material. Walking boots usually have adjustable straps to secure them over the foot and leg. Your health care provider may prescribe a walking boot if you can put weight (bear weight) on your injured foot. How much you can walk while wearing the boot will depend on the type and severity of your injury. Your health care provider will recommend the best boot for you based on your condition. How do I put on my walking boot? There are different types of walking boots. Each type of boot has specific instructions about how to wear it properly. Follow instructions from your health care provider about wearing your boot. In general:  Ask someone to help you put on the boot, if needed.  Sit to put on your boot. Doing this is more comfortable and it helps to prevent falls.  Open up the boot fully. Place your foot into the boot so your heel rests against the back.  Your toes should be supported by the base of the boot. They should not hang over the front edge.  Adjust the straps so the boot fits securely but is not too tight.  Do not bend the hard frame of the boot to get a good fit. What are some tips for walking with a walking boot?  Do not try to walk without wearing the boot unless your health care provider has approved.  Use other assistive walking devices, including crutches and canes, as told by your health care provider.  On your uninjured foot, wear a shoe with a heel that is close to the height of the walking boot.  Be careful when walking on surfaces that are uneven or wet. How can I reduce swelling while using a walking  boot?   Rest your injured foot or leg as much as possible.  If directed, apply ice to the injured area: ? Put ice in a plastic bag. ? Place a towel between your skin and the bag. ? Leave the ice on for 20 minutes, 2-3 times a day.  Keep your injured foot or leg raised (elevated) above the level of your heart whenever able. Try to do this for at least 2?3 hours each day or as told by your health care provider.  If swelling gets worse, loosen the boot and rest and elevate your foot and leg. How should I take care of my skin and foot while using a walking boot?  Wear a long sock to protect your foot and leg from rubbing inside the boot.  Take off the boot one time each day to check the injured area. Look at your foot, surrounding skin, and leg to make sure there are no sores, rashes, swelling, or wounds. The skin should be a healthy color, not pale or blue.  Try to notice if your walking pattern (gait) in the boot is fairly normal and that you are not walking with a noticeable limp.  Follow instructions from your health care provider about taking care of your incision or wound, if this applies.  Clean and wash the injured area as told by your health care provider.  Gently dry  your foot and leg before putting the boot back on. Are there any activity restrictions? Activity restrictions depend on the type and severity of your injury. Follow instructions from your health care provider about limiting activities.  Bathe and shower as told by your health care provider.  Do not do any activities that could make your injury worse.  Do not drive if your affected foot is one that you usually use for driving. When can I remove my walking boot? Always follow specific directions from your health care provider for removing the walking boot. Generally, it is okay to remove your walking boot:  At the end of the day when you are resting or sleeping.  To clean your foot and leg. How should I keep  my walking boot clean?  Do not put any part of the boot in a washing machine or dryer.  Do not use chemical cleaning products. These could irritate your skin, especially if you have a wound or an incision.  Do not soak the liner of the boot.  Use a washcloth with mild soap and water to clean the frame and the liner of the boot by hand.  Allow the boot to air-dry completely before you put it back on your foot. Contact a health care provider if:  The boot is cracked or damaged.  The boot does not fit properly.  Your foot or leg hurts.  You have a rash, sore, or open sore (ulcer) on your foot or leg.  The skin on your foot or leg is pale.  You have a wound or incision on the foot and it is getting worse.  Your skin becomes painful, red, or irritated.  Your swelling does not get better or it gets worse. Get help right away if:  You cannot feel your foot or leg (have numbness).  You cannot feel a pulse at the top of your foot, where your foot and ankle meet.  Your skin on the foot or leg is cold, blue, or gray. Summary  A walking boot holds your foot or ankle in place after an injury or a medical procedure.  There are different types of walking boots. Follow the specific instructions about how to correctly wear the boot that you have.  Ask someone to help you put on the boot, if needed.  It is important to check your skin and foot every day. Call your health care provider if you notice a rash or sore on your foot or leg. This information is not intended to replace advice given to you by your health care provider. Make sure you discuss any questions you have with your health care provider. Document Released: 05/09/2014 Document Revised: 01/31/2016 Document Reviewed: 01/31/2016 Elsevier Interactive Patient Education  2019 [employer] Inc[employer]   WEAR BOOT AT ALL TIMES WHEN OUT OF BED EXCEPT TO SHOWER.      RIB FRACTURES  HOME INSTRUCTIONS   1. PAIN CONTROL:  1. Pain is  best controlled by a usual combination of three different methods TOGETHER:  i. Ice/Heat ii. Over the counter pain medication iii. Prescription pain medication 2. You may experience some swelling and bruising in area of broken ribs. Ice packs or heating pads (30-60 minutes up to 6 times a day) will help. Use ice for the first few days to help decrease swelling and bruising, then switch to heat to help relax tight/sore spots and speed recovery. Some people prefer to use ice alone, heat alone, alternating between ice & heat. Experiment  to what works for you. Swelling and bruising can take several weeks to resolve.  3. It is helpful to take an over-the-counter pain medication regularly for the first few weeks. Choose one of the following that works best for you:  i. Naproxen (Aleve, etc) Two 220mg  tabs twice a day ii. Ibuprofen (Advil, etc) Three 200mg  tabs four times a day (every meal & bedtime) iii. Acetaminophen (Tylenol, etc) 500-650mg  four times a day (every meal & bedtime) 4. A prescription for pain medication (such as oxycodone, hydrocodone, etc) may be given to you upon discharge. Take your pain medication as prescribed.  i. If you are having problems/concerns with the prescription medicine (does not control pain, nausea, vomiting, rash, itching, etc), please call us [phone] to see if we need to switch you to a different pain medicine that will work better for you and/or control your side effect better. ii. If you need a refill on your pain medication, please contact your pharmacy. They will contact our office to request authorization. Prescriptions will not be filled after 5 pm or on week-ends. 1. Avoid getting constipated. When taking pain medications, it is common to experience some constipation. Increasing fluid intake and taking a fiber supplement (such as Metamucil, Citrucel, FiberCon, MiraLax, etc) 1-2 times a day regularly will usually help prevent this problem from occurring. A mild  laxative (prune juice, Milk of Magnesia, MiraLax, etc) should be taken according to package directions if there are no bowel movements after 48 hours.  2. Watch out for diarrhea. If you have many loose bowel movements, simplify your diet to bland foods & liquids for a few days. Stop any stool softeners and decrease your fiber supplement. Switching to mild anti-diarrheal medications (Kayopectate, Pepto Bismol) can help. If this worsens or does not improve, please call us. 3. FOLLOW UP  a. If a follow up appointment is needed one will be scheduled for you. If none is needed with our trauma team, please follow up with your primary care provider within 2-3 weeks from discharge. Please call [facility] at [phone] if you have any questions about follow up.  b. If you have any orthopedic or other injuries you will need to follow up as outlined in your follow up instructions.   WHEN TO CALL us [phone]:  1. Poor pain control 2. Reactions / problems with new medications (rash/itching, nausea, etc)  3. Fever over 101.5 F (38.5 C) 4. Worsening swelling or bruising 5. Worsening pain, productive cough, difficulty breathing or any other concerning symptoms  The clinic staff is available to answer your questions during regular business hours (8:30am-5pm). Please dont hesitate to call and ask to speak to one of our nurses for clinical concerns.  If you have a medical emergency, go to the nearest emergency room or call 911.  A surgeon from [facility] is always on call at the [facility], [place]  [address], [address], [place], [place] [address] ?  MAIN: [phone] ? TOLL FREE: [phone] ?  FAX [phone]  [email or web address]      Information on Rib Fractures  A rib fracture is a break or crack in one of the bones of the ribs. The ribs are long, curved bones that wrap around your chest and attach to your spine and your breastbone.  The ribs protect your heart, lungs, and other organs in the chest. A broken or cracked rib is often painful but is not usually serious.  Most rib fractures heal on their own over time. However, rib fractures can be more serious if multiple ribs are broken or if broken ribs move out of place and push against other structures or organs. What are the causes? This condition is caused by:  Repetitive movements with high force, such as pitching a baseball or having severe coughing spells.  A direct blow to the chest, such as a sports injury, a car accident, or a fall.  Cancer that has spread to the bones, which can weaken bones and cause them to break. What are the signs or symptoms? Symptoms of this condition include:  Pain when you breathe in or cough.  Pain when someone presses on the injured area.  Feeling short of breath. How is this diagnosed? This condition is diagnosed with a physical exam and medical history. Imaging tests may also be done, such as:  Chest X-ray.  CT scan.  MRI.  Bone scan.  Chest ultrasound. How is this treated? Treatment for this condition depends on the severity of the fracture. Most rib fractures usually heal on their own in 1-3 months. Sometimes healing takes longer if there is a cough that does not stop or if there are other activities that make the injury worse (aggravating factors). While you heal, you will be given medicines to control the pain. You will also be taught deep breathing exercises. Severe injuries may require hospitalization or surgery. Follow these instructions at home: Managing pain, stiffness, and swelling  If directed, apply ice to the injured area. ? Put ice in a plastic bag. ? Place a towel between your skin and the bag. ? Leave the ice on for 20 minutes, 2-3 times a day.  Take over-the-counter and prescription medicines only as told by your health care provider. Activity  Avoid a lot of activity and any activities or  movements that cause pain. Be careful during activities and avoid bumping the injured rib.  Slowly increase your activity as told by your health care provider. General instructions  Do deep breathing exercises as told by your health care provider. This helps prevent pneumonia, which is a common complication of a broken rib. Your health care provider may instruct you to: ? Take deep breaths several times a day. ? Try to cough several times a day, holding a pillow against the injured area. ? Use a device called incentive spirometer to practice deep breathing several times a day.  Drink enough fluid to keep your urine pale yellow.  Do not wear a rib belt or binder. These restrict breathing, which can lead to pneumonia.  Keep all follow-up visits as told by your health care provider. This is important. Contact a health care provider if:  You have a fever. Get help right away if:  You have difficulty breathing or you are short of breath.  You develop a cough that does not stop, or you cough up thick or bloody sputum.  You have nausea, vomiting, or pain in your abdomen.  Your pain gets worse and medicine does not help. Summary  A rib fracture is a break or crack in one of the bones of the ribs.  A broken or cracked rib is often painful but is not usually serious.  Most rib fractures heal on their own over time.  Treatment for this condition depends on the severity of the fracture.  Avoid a lot of activity and any activities or movements that cause pain. This information is not intended to replace advice  given to you by your health care provider. Make sure you discuss any questions you have with your health care provider. Document Released: 12/23/2004 Document Revised: 03/24/2016 Document Reviewed: 03/24/2016 Elsevier Interactive Patient Education  2019 [employer]

## 2018-01-19 NOTE — Progress Notes (Signed)
Bilateral lower extremity venous duplex completed - preliminary results found in chart review CV Proc. [employer], RVS 01/19/2017, 12:24 PM
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
## 2018-01-20 ENCOUNTER — Encounter (HOSPITAL_COMMUNITY): Payer: Self-pay | Admitting: Emergency Medicine

## 2020-06-11 NOTE — H&P (Signed)
HISTORY AND PHYSICAL  Tyler Mata is a 45 y.o. male patient with CC: Painful teeth.  No diagnosis found.  Past Medical History:  Diagnosis Date  . Schizophrenia (HCC)     No current facility-administered medications for this encounter.   Current Outpatient Medications  Medication Sig Dispense Refill  . paliperidone (INVEGA SUSTENNA) 156 MG/ML SUSY injection Inject 156 mg into the muscle every 30 (thirty) days.     No Known Allergies Active Problems:   * No active hospital problems. *  Vitals: There were no vitals taken for this visit. Lab results:No results found for this or any previous visit (from the past 24 hour(s)). Radiology Results: No results found. General appearance: alert, cooperative and no distress Head: Normocephalic, without obvious abnormality, atraumatic Eyes: negative Nose: Nares normal. Septum midline. Mucosa normal. No drainage or sinus tenderness. Throat: Multiple carious and periodontally involved teeth. No purulence, edema, fluctuance, trismus. pharynx clear.  Neck: no adenopathy and supple, symmetrical, trachea midline Resp: clear to auscultation bilaterally Cardio: regular rate and rhythm, S1, S2 normal, no murmur, click, rub or gallop  Assessment: Non-restorable teeth secondary to dental caries, periodontitis.   Plan: Full mouth extraction with alveoloplasty. GA. Day surgery.   Ocie Doyne 06/11/2020

## 2020-06-14 ENCOUNTER — Encounter (HOSPITAL_COMMUNITY): Payer: Self-pay | Admitting: Oral Surgery

## 2020-06-14 NOTE — Progress Notes (Addendum)
I spoke to Tyler Mata ,Tyler Mata's mother and Legal Guardian. I asked Tyler Mata to bring the guardianship papers with her.  Tyler Mata has Schizophrenia and is managed with Gean Birchwood monthly injection.  Patient lives alone. Tyler Mata checks on patient and cooks some meals.   Tyler Mata smokes 2 packs of cigarettes a day and vapes- Tyler Mata does not know how much he vapes. Patient has been instructed by Tyler Mata and his mother that he needs to stop smoking before surgery.  Tyler Mata wants anyone that will prescribe pain medication to not give him anything addictive, he had a problem years ago."  I told Tyler. Mata that it would be Tyler Mata and she will be able to talk with him after Tyler Mata's surgery. I called  Tyler Mata office and was told that Tyler Mata had called the office and gave the same information.  Tyler Mata does not have a PCP, he is seen a [facility] by a psychiartrist.

## 2020-06-15 ENCOUNTER — Encounter (HOSPITAL_COMMUNITY): Payer: Self-pay | Admitting: Oral Surgery

## 2020-06-15 ENCOUNTER — Encounter (HOSPITAL_COMMUNITY)
Admission: RE | Disposition: A | Discharge status: Home / Self Care | Payer: Self-pay | Source: Home / Self Care | Attending: Oral Surgery

## 2020-06-15 ENCOUNTER — Ambulatory Visit (HOSPITAL_COMMUNITY)
Admission: RE | Admit: 2020-06-15 | Discharge: 2020-06-15 | Disposition: A | Discharge status: Home / Self Care | Payer: Medicare Other | Attending: Oral Surgery | Admitting: Oral Surgery

## 2020-06-15 ENCOUNTER — Ambulatory Visit (HOSPITAL_COMMUNITY): Payer: Medicare Other | Admitting: Certified Registered Nurse Anesthetist

## 2020-06-15 ENCOUNTER — Other Ambulatory Visit: Payer: Self-pay

## 2020-06-15 DIAGNOSIS — K0530 Chronic periodontitis, unspecified: Secondary | ICD-10-CM | POA: Insufficient documentation

## 2020-06-15 DIAGNOSIS — Z79899 Other long term (current) drug therapy: Secondary | ICD-10-CM | POA: Insufficient documentation

## 2020-06-15 DIAGNOSIS — F17200 Nicotine dependence, unspecified, uncomplicated: Secondary | ICD-10-CM | POA: Insufficient documentation

## 2020-06-15 DIAGNOSIS — K029 Dental caries, unspecified: Secondary | ICD-10-CM | POA: Diagnosis present

## 2020-06-15 HISTORY — DX: Pneumonia, unspecified organism: J18.9

## 2020-06-15 HISTORY — PX: TOOTH EXTRACTION: SHX859

## 2020-06-15 LAB — CBC
HCT: 48.8 % (ref 39.0–52.0)
Hemoglobin: 16.1 g/dL (ref 13.0–17.0)
MCH: 32.2 pg (ref 26.0–34.0)
MCHC: 33 g/dL (ref 30.0–36.0)
MCV: 97.6 fL (ref 80.0–100.0)
Platelets: 214 10*3/uL (ref 150–400)
RBC: 5 MIL/uL (ref 4.22–5.81)
RDW: 13.4 % (ref 11.5–15.5)
WBC: 6.9 10*3/uL (ref 4.0–10.5)
nRBC: 0 % (ref 0.0–0.2)

## 2020-06-15 SURGERY — DENTAL RESTORATION/EXTRACTIONS
Anesthesia: General

## 2020-06-15 MED ORDER — MIDAZOLAM HCL 2 MG/2ML IJ SOLN
INTRAMUSCULAR | Status: AC
  Filled 2020-06-15: qty 2

## 2020-06-15 MED ORDER — 0.9 % SODIUM CHLORIDE (POUR BTL) OPTIME
TOPICAL | Status: DC | PRN
  Administered 2020-06-15: 1000 mL

## 2020-06-15 MED ORDER — AMISULPRIDE (ANTIEMETIC) 5 MG/2ML IV SOLN: 10.0000 mg | Freq: Once | INTRAVENOUS | Status: DC | PRN

## 2020-06-15 MED ORDER — LACTATED RINGERS IV SOLN: INTRAVENOUS | Status: DC

## 2020-06-15 MED ORDER — LIDOCAINE HCL (PF) 2 % IJ SOLN
INTRAMUSCULAR | Status: AC
  Filled 2020-06-15: qty 10

## 2020-06-15 MED ORDER — NICOTINE 21 MG/24HR TD PT24: 21.0000 mg | MEDICATED_PATCH | Freq: Every day | TRANSDERMAL | 0 refills | Status: DC

## 2020-06-15 MED ORDER — OXYCODONE HCL 5 MG/5ML PO SOLN: 5.0000 mg | Freq: Once | ORAL | Status: AC | PRN

## 2020-06-15 MED ORDER — CEFAZOLIN SODIUM-DEXTROSE 2-4 GM/100ML-% IV SOLN
2.0000 g | INTRAVENOUS | Status: AC
  Administered 2020-06-15: 2 g via INTRAVENOUS

## 2020-06-15 MED ORDER — AMOXICILLIN 500 MG PO CAPS: 500.0000 mg | ORAL_CAPSULE | Freq: Three times a day (TID) | ORAL | 0 refills | Status: DC

## 2020-06-15 MED ORDER — MIDAZOLAM HCL 5 MG/5ML IJ SOLN
INTRAMUSCULAR | Status: DC | PRN
  Administered 2020-06-15: 2 mg via INTRAVENOUS

## 2020-06-15 MED ORDER — PROPOFOL 10 MG/ML IV BOLUS
INTRAVENOUS | Status: AC
  Filled 2020-06-15: qty 40

## 2020-06-15 MED ORDER — LIDOCAINE-EPINEPHRINE 2 %-1:100000 IJ SOLN
INTRAMUSCULAR | Status: DC | PRN
  Administered 2020-06-15: 20 mL via INTRADERMAL

## 2020-06-15 MED ORDER — PROMETHAZINE HCL 25 MG/ML IJ SOLN: 6.2500 mg | INTRAMUSCULAR | Status: DC | PRN

## 2020-06-15 MED ORDER — DEXMEDETOMIDINE (PRECEDEX) IN NS 20 MCG/5ML (4 MCG/ML) IV SYRINGE
PREFILLED_SYRINGE | INTRAVENOUS | Status: AC
  Filled 2020-06-15: qty 5

## 2020-06-15 MED ORDER — ONDANSETRON HCL 4 MG/2ML IJ SOLN
INTRAMUSCULAR | Status: AC
  Filled 2020-06-15: qty 4

## 2020-06-15 MED ORDER — HYDROMORPHONE HCL 1 MG/ML IJ SOLN
0.2500 mg | INTRAMUSCULAR | Status: DC | PRN
  Administered 2020-06-15: 0.5 mg via INTRAVENOUS

## 2020-06-15 MED ORDER — DEXAMETHASONE SODIUM PHOSPHATE 10 MG/ML IJ SOLN
INTRAMUSCULAR | Status: AC
  Filled 2020-06-15: qty 2

## 2020-06-15 MED ORDER — FENTANYL CITRATE (PF) 250 MCG/5ML IJ SOLN
INTRAMUSCULAR | Status: AC
  Filled 2020-06-15: qty 5

## 2020-06-15 MED ORDER — OXYCODONE-ACETAMINOPHEN 5-325 MG PO TABS: 1.0000 | ORAL_TABLET | ORAL | 0 refills | Status: AC | PRN

## 2020-06-15 MED ORDER — LABETALOL HCL 5 MG/ML IV SOLN
INTRAVENOUS | Status: AC
  Filled 2020-06-15: qty 4

## 2020-06-15 MED ORDER — ORAL CARE MOUTH RINSE: 15.0000 mL | Freq: Once | OROMUCOSAL | Status: AC

## 2020-06-15 MED ORDER — FENTANYL CITRATE (PF) 100 MCG/2ML IJ SOLN: INTRAMUSCULAR | Status: DC | PRN

## 2020-06-15 MED ORDER — PROPOFOL 10 MG/ML IV BOLUS
INTRAVENOUS | Status: DC | PRN
  Administered 2020-06-15: 150 mg via INTRAVENOUS

## 2020-06-15 MED ORDER — LABETALOL HCL 5 MG/ML IV SOLN
10.0000 mg | INTRAVENOUS | Status: DC | PRN
  Administered 2020-06-15: 10 mg via INTRAVENOUS

## 2020-06-15 MED ORDER — LIDOCAINE-EPINEPHRINE 2 %-1:100000 IJ SOLN
INTRAMUSCULAR | Status: AC
  Filled 2020-06-15: qty 1

## 2020-06-15 MED ORDER — CEFAZOLIN SODIUM-DEXTROSE 2-4 GM/100ML-% IV SOLN
INTRAVENOUS | Status: AC
  Filled 2020-06-15: qty 100

## 2020-06-15 MED ORDER — CHLORHEXIDINE GLUCONATE 0.12 % MT SOLN
OROMUCOSAL | Status: AC
  Administered 2020-06-15: 15 mL via OROMUCOSAL
  Filled 2020-06-15: qty 15

## 2020-06-15 MED ORDER — OXYMETAZOLINE HCL 0.05 % NA SOLN
NASAL | Status: AC
  Filled 2020-06-15: qty 30

## 2020-06-15 MED ORDER — SODIUM CHLORIDE 0.9 % IR SOLN
Status: DC | PRN
  Administered 2020-06-15: 1000 mL

## 2020-06-15 MED ORDER — IBUPROFEN 800 MG PO TABS: 800.0000 mg | ORAL_TABLET | Freq: Three times a day (TID) | ORAL | 0 refills | Status: DC | PRN

## 2020-06-15 MED ORDER — CHLORHEXIDINE GLUCONATE 0.12 % MT SOLN: 15.0000 mL | Freq: Once | OROMUCOSAL | Status: AC

## 2020-06-15 MED ORDER — SUGAMMADEX SODIUM 200 MG/2ML IV SOLN
INTRAVENOUS | Status: DC | PRN
  Administered 2020-06-15: 200 mg via INTRAVENOUS

## 2020-06-15 MED ORDER — OXYCODONE HCL 5 MG PO TABS: 5.0000 mg | ORAL_TABLET | Freq: Once | ORAL | Status: AC | PRN

## 2020-06-15 MED ORDER — PROPOFOL 10 MG/ML IV BOLUS
INTRAVENOUS | Status: AC
  Filled 2020-06-15: qty 20

## 2020-06-15 MED ORDER — HYDROMORPHONE HCL 1 MG/ML IJ SOLN
INTRAMUSCULAR | Status: AC
  Filled 2020-06-15: qty 1

## 2020-06-15 MED ORDER — FENTANYL CITRATE (PF) 100 MCG/2ML IJ SOLN
INTRAMUSCULAR | Status: DC | PRN
  Administered 2020-06-15: 100 ug via INTRAVENOUS
  Administered 2020-06-15: 50 ug via INTRAVENOUS
  Administered 2020-06-15: 100 ug via INTRAVENOUS

## 2020-06-15 MED ORDER — SUCCINYLCHOLINE CHLORIDE 200 MG/10ML IV SOSY
PREFILLED_SYRINGE | INTRAVENOUS | Status: AC
  Filled 2020-06-15: qty 10

## 2020-06-15 MED ORDER — DEXAMETHASONE SODIUM PHOSPHATE 4 MG/ML IJ SOLN
INTRAMUSCULAR | Status: DC | PRN
  Administered 2020-06-15: 10 mg via INTRAVENOUS

## 2020-06-15 MED ORDER — ROCURONIUM BROMIDE 10 MG/ML (PF) SYRINGE
PREFILLED_SYRINGE | INTRAVENOUS | Status: AC
  Filled 2020-06-15: qty 10

## 2020-06-15 MED ORDER — DEXMEDETOMIDINE (PRECEDEX) IN NS 20 MCG/5ML (4 MCG/ML) IV SYRINGE
PREFILLED_SYRINGE | INTRAVENOUS | Status: DC | PRN
  Administered 2020-06-15: 4 ug via INTRAVENOUS
  Administered 2020-06-15 (×2): 8 ug via INTRAVENOUS

## 2020-06-15 MED ORDER — ROCURONIUM BROMIDE 10 MG/ML (PF) SYRINGE
PREFILLED_SYRINGE | INTRAVENOUS | Status: DC | PRN
  Administered 2020-06-15: 80 mg via INTRAVENOUS

## 2020-06-15 MED ORDER — ONDANSETRON HCL 4 MG/2ML IJ SOLN
INTRAMUSCULAR | Status: DC | PRN
  Administered 2020-06-15: 4 mg via INTRAVENOUS

## 2020-06-15 MED ORDER — OXYCODONE HCL 5 MG PO TABS
ORAL_TABLET | ORAL | Status: AC
  Administered 2020-06-15: 5 mg via ORAL
  Filled 2020-06-15: qty 1

## 2020-06-15 MED ORDER — LIDOCAINE HCL (CARDIAC) PF 100 MG/5ML IV SOSY
PREFILLED_SYRINGE | INTRAVENOUS | Status: DC | PRN
  Administered 2020-06-15: 60 mg via INTRAVENOUS

## 2020-06-15 MED ORDER — MEPERIDINE HCL 25 MG/ML IJ SOLN: 6.2500 mg | INTRAMUSCULAR | Status: DC | PRN

## 2020-06-15 SURGICAL SUPPLY — 39 items
BLADE SURG 15 STRL LF DISP TIS (BLADE) ×1 IMPLANT
BLADE SURG 15 STRL SS (BLADE) ×3
BUR CROSS CUT FISSURE 1.6 (BURR) ×2 IMPLANT
BUR CROSS CUT FISSURE 1.6MM (BURR) ×1
BUR EGG ELITE 4.0 (BURR) ×2 IMPLANT
BUR EGG ELITE 4.0MM (BURR) ×1
CANISTER SUCT 3000ML PPV (MISCELLANEOUS) ×3 IMPLANT
COVER SURGICAL LIGHT HANDLE (MISCELLANEOUS) ×3 IMPLANT
COVER WAND RF STERILE (DRAPES) IMPLANT
DECANTER SPIKE VIAL GLASS SM (MISCELLANEOUS) ×3 IMPLANT
DRAPE U-SHAPE 76X120 STRL (DRAPES) ×3 IMPLANT
GAUZE PACKING FOLDED 2  STR (GAUZE/BANDAGES/DRESSINGS) ×3
GAUZE PACKING FOLDED 2 STR (GAUZE/BANDAGES/DRESSINGS) ×1 IMPLANT
GLOVE BIO SURGEON STRL SZ 6.5 (GLOVE) IMPLANT
GLOVE BIO SURGEON STRL SZ7 (GLOVE) IMPLANT
GLOVE BIO SURGEON STRL SZ8 (GLOVE) ×3 IMPLANT
GLOVE BIO SURGEONS STRL SZ 6.5 (GLOVE)
GLOVE SURG UNDER POLY LF SZ6.5 (GLOVE) IMPLANT
GLOVE SURG UNDER POLY LF SZ7 (GLOVE) IMPLANT
GOWN STRL REUS W/ TWL LRG LVL3 (GOWN DISPOSABLE) ×1 IMPLANT
GOWN STRL REUS W/ TWL XL LVL3 (GOWN DISPOSABLE) ×1 IMPLANT
GOWN STRL REUS W/TWL LRG LVL3 (GOWN DISPOSABLE) ×3
GOWN STRL REUS W/TWL XL LVL3 (GOWN DISPOSABLE) ×3
IV NS 1000ML (IV SOLUTION) ×3
IV NS 1000ML BAXH (IV SOLUTION) ×1 IMPLANT
KIT BASIN OR (CUSTOM PROCEDURE TRAY) ×3 IMPLANT
KIT TURNOVER KIT B (KITS) ×3 IMPLANT
NDL HYPO 25GX1X1/2 BEV (NEEDLE) ×2 IMPLANT
NEEDLE HYPO 25GX1X1/2 BEV (NEEDLE) ×6 IMPLANT
NS IRRIG 1000ML POUR BTL (IV SOLUTION) ×3 IMPLANT
PAD ARMBOARD 7.5X6 YLW CONV (MISCELLANEOUS) ×3 IMPLANT
SLEEVE IRRIGATION ELITE 7 (MISCELLANEOUS) ×3 IMPLANT
SPONGE SURGIFOAM ABS GEL 12-7 (HEMOSTASIS) IMPLANT
SUT CHROMIC 3 0 PS 2 (SUTURE) ×3 IMPLANT
SYR BULB IRRIG 60ML STRL (SYRINGE) ×3 IMPLANT
SYR CONTROL 10ML LL (SYRINGE) ×3 IMPLANT
TRAY ENT MC OR (CUSTOM PROCEDURE TRAY) ×3 IMPLANT
TUBING IRRIGATION (MISCELLANEOUS) ×3 IMPLANT
YANKAUER SUCT BULB TIP NO VENT (SUCTIONS) ×3 IMPLANT

## 2020-06-15 NOTE — Anesthesia Postprocedure Evaluation (Signed)
Anesthesia Post Note  Patient: Tyler Mata  Procedure(s) Performed: DENTAL RESTORATION/EXTRACTIONS     Patient location during evaluation: PACU Anesthesia Type: General Level of consciousness: awake and alert Pain management: pain level controlled Vital Signs Assessment: post-procedure vital signs reviewed and stable Respiratory status: spontaneous breathing, nonlabored ventilation and respiratory function stable Cardiovascular status: blood pressure returned to baseline and stable Postop Assessment: no apparent nausea or vomiting Anesthetic complications: no   No notable events documented.  Last Vitals:  Vitals:   06/15/20 0920 06/15/20 0950  BP: (!) 132/98 (!) 130/98  Pulse: 79 69  Resp: 16 18  Temp:  36.4 C  SpO2: 97% 97%    Last Pain:  Vitals:   06/15/20 0950  TempSrc:   PainSc: 2                  Lowella Curb

## 2020-06-15 NOTE — H&P (Signed)
H&P documentation  -History and Physical Reviewed  -Patient has been re-examined  -No change in the plan of care  Tyler Mata  

## 2020-06-15 NOTE — Op Note (Signed)
06/15/2020  8:38 AM  PATIENT:  Tyler Mata  45 y.o. male  PRE-OPERATIVE DIAGNOSIS:  NON RESTORABLE TEETH #6, 7, 11, 14, 15, 19, 20, 21, 22, 23, 24, 25, 26, 27, 28, 29, 30, 31, POST-OPERATIVE DIAGNOSIS:  SAME  PROCEDURE:  Procedure(s):  DENTAL EXTRACTIONS TEETH #6, 7, 11, 14, 15, 19, 20, 21, 22, 23, 24, 25, 26, 27, 28, 29, 30, 31, ALVEOLOPLASTY RIGHT AND LEFT MAXILLA AND MANDIBLE  SURGEON:  Surgeon(s): Ocie Doyne, DMD  ANESTHESIA:   local and general  EBL:  minimal  DRAINS: none   SPECIMEN:  No Specimen  COUNTS:  YES  PLAN OF CARE: Discharge to home after PACU  PATIENT DISPOSITION:  PACU - hemodynamically stable.   PROCEDURE DETAILS: Dictation # [id number]  Tyler Mata, DMD 06/15/2020 8:38 AM

## 2020-06-15 NOTE — Anesthesia Preprocedure Evaluation (Signed)
Anesthesia Evaluation  Patient identified by MRN, date of birth, ID band Patient awake    Reviewed: Allergy & Precautions, NPO status , Patient's Chart, lab work & pertinent test results  Airway Mallampati: II  TM Distance: >3 FB Neck ROM: Full    Dental no notable dental hx.    Pulmonary neg pulmonary ROS, Current Smoker and Patient abstained from smoking.,    Pulmonary exam normal breath sounds clear to auscultation       Cardiovascular negative cardio ROS Normal cardiovascular exam Rhythm:Regular Rate:Normal     Neuro/Psych Schizophrenia negative neurological ROS  negative psych ROS   GI/Hepatic negative GI ROS, Neg liver ROS,   Endo/Other  negative endocrine ROS  Renal/GU negative Renal ROS  negative genitourinary   Musculoskeletal negative musculoskeletal ROS (+)   Abdominal   Peds negative pediatric ROS (+)  Hematology negative hematology ROS (+)   Anesthesia Other Findings   Reproductive/Obstetrics negative OB ROS                             Anesthesia Physical Anesthesia Plan  ASA: 3  Anesthesia Plan: General   Post-op Pain Management:    Induction: Intravenous  PONV Risk Score and Plan: 1 and Ondansetron and Treatment may vary due to age or medical condition  Airway Management Planned: Nasal ETT  Additional Equipment:   Intra-op Plan:   Post-operative Plan: Extubation in OR  Informed Consent: I have reviewed the patients History and Physical, chart, labs and discussed the procedure including the risks, benefits and alternatives for the proposed anesthesia with the patient or authorized representative who has indicated his/her understanding and acceptance.     Dental advisory given  Plan Discussed with: CRNA  Anesthesia Plan Comments:         Anesthesia Quick Evaluation

## 2020-06-15 NOTE — Anesthesia Procedure Notes (Signed)
Procedure Name: Intubation Date/Time: 06/15/2020 7:34 AM Performed by: Lynda Rainwater, MD Pre-anesthesia Checklist: Patient identified, Emergency Drugs available, Suction available and Patient being monitored Patient Re-evaluated:Patient Re-evaluated prior to induction Oxygen Delivery Method: Circle system utilized Preoxygenation: Pre-oxygenation with 100% oxygen Induction Type: IV induction Ventilation: Mask ventilation without difficulty Laryngoscope Size: Mac and 4 Grade View: Grade I Tube type: Oral Tube size: 7.5 mm Number of attempts: 1 Airway Equipment and Method: Stylet and Oral airway Placement Confirmation: ETT inserted through vocal cords under direct vision, positive ETCO2 and breath sounds checked- equal and bilateral Secured at: 22 cm Tube secured with: Tape Dental Injury: Bloody posterior oropharynx  Comments: Nasal intubation attempted x4 (2x SRNA, 1x CRNA, 1x MDA), unable to pass nasal rae through cords. DVL by MD with successful oral intubation.

## 2020-06-15 NOTE — Transfer of Care (Signed)
Immediate Anesthesia Transfer of Care Note  Patient: Tyler Mata  Procedure(s) Performed: DENTAL RESTORATION/EXTRACTIONS  Patient Location: PACU  Anesthesia Type:General  Level of Consciousness: drowsy  Airway & Oxygen Therapy: Patient Spontanous Breathing and Patient connected to face mask oxygen  Post-op Assessment: Report given to RN and Post -op Vital signs reviewed and stable  Post vital signs: Reviewed and stable  Last Vitals:  Vitals Value Taken Time  BP 135/101 06/15/20 0851  Temp    Pulse 96 06/15/20 0855  Resp 20 06/15/20 0855  SpO2 100 % 06/15/20 0855  Vitals shown include unvalidated device data.  Last Pain:  Vitals:   06/15/20 0644  TempSrc:   PainSc: 0-No pain         Complications: No notable events documented.

## 2020-06-15 NOTE — Op Note (Signed)
NAMECLAIR, Tyler Mata MEDICAL RECORD NO: [id number] ACCOUNT NO: [id number] DATE OF BIRTH: Aug 14, 1975 FACILITY: [facility] LOCATION: [facility] PHYSICIAN: Georgia Lopes, DDS  Operative Report   DATE OF PROCEDURE: 06/15/2020  PREOPERATIVE DIAGNOSES:  Multiple nonrestorable teeth secondary to dental caries and periodontitis numbers 6, 7, 11, 14, 15, 19, 20, 21, 22, 23, 24, 25, 26, 27, 28, 29, 30, 31.  PROCEDURE:  Extraction teeth numbers 6, 7, 11, 14, 15, 19, 20, 21, 22, 23, 24, 25, 26, 27, 28, 29, 30; alveoplasty right and left maxilla and mandible.  SURGEON:  Ocie Doyne, DDS  ANESTHESIA:  General. Dr. Hyacinth Meeker, attending.  Oral intubation.  DESCRIPTION OF PROCEDURE:  The patient was taken to the operating room and placed on the table in supine position.  General anesthesia was administered.  An oral endotracheal tube was placed after two unsuccessful attempts at nasal intubation.  The tube  was secured.  The patient's eyes were protected.  The patient was draped for surgery.  A timeout was performed.  The posterior pharynx was suctioned and a throat pack was placed.  2% lidocaine with 1:100,000 epinephrine was infiltrated in an inferior  alveolar block on the right and left sides and in buccal and palatal infiltration and the maxilla around the teeth to be removed.  A bite block was placed on the right side of the mouth.  A sweetheart retractor was used to retract the tongue.  A #15  blade was used to make an incision beginning in the left mandible. At tooth #19, the incision was made in the buccal and lingual sulcus of the teeth going from 19 to tooth #26.  The periosteum was reflected.  The teeth were elevated with 301 elevator and  removed from the mouth with a dental forceps and rongeurs.  The sockets were curetted.  The tissue was trimmed and reflected to expose the alveolar crest, which was irregular in contour and had multiple undercuts.  Alveoplasty was performed using the  egg bur and then  the bone file was used to further smooth the area and then the area was irrigated and closed with 3-0 chromic. In the left maxilla, a 15 blade was used to make an incision around teeth numbers 14 and 15 buccally and palatally and the  incision was carried forward to tooth #11 and the sulcus was incised circumferentially around the tooth.  The periosteum was reflected.  The teeth were elevated with a 301 elevator and removed from the mouth with a dental forceps.  The sockets were  curetted.  The periosteum was reflected to expose multiple sharp edges of bone, due to the root prominences of the teeth that were extracted, these were removed using the alveoplasty technique with an egg bur followed by the bone file.  Then the area was  irrigated and closed with 3-0 chromic.  Then the throat pack was removed and the tube was repositioned and secured to the other side of the mouth.  The throat pack was repositioned in the throat and then the right side was operated. A 15 blade was used  to make an incision beginning at tooth #31, carried forward to tooth #27 in the buccal and lingual sulcus. An incision was made around teeth numbers 6 and 7 in the maxilla.  The periosteum was reflected.  The teeth were elevated and removed from the  mouth with a dental forceps.  The sockets were curetted and then the periosteum was reflected to expose the alveolar bone, which  was irregular in contour and had sharp edges from root prominences, then the alveoplasty was performed in the right maxilla  and mandible using the egg-shaped bur under irrigation and Stryker handpiece followed by the bone file, then the area was irrigated and closed with 3-0 chromic.  The oral cavity was then irrigated and suctioned.  The throat pack was removed.  The patient  was left in the care of anesthesia for extubation and transport to recovery room with plans for discharge home through day surgery.  ESTIMATED BLOOD LOSS:  Minimum.  COMPLICATIONS:   None.   SHW D: 06/15/2020 8:43:35 am T: 06/15/2020 9:10:00 am  JOB: [id number]/ [id number]

## 2020-06-15 NOTE — H&P (Signed)
Anesthesia H&P Update: History and Physical Exam reviewed; patient is OK for planned anesthetic and procedure. ? ?

## 2020-06-16 ENCOUNTER — Encounter (HOSPITAL_COMMUNITY): Payer: Self-pay | Admitting: Oral Surgery

## 2020-10-08 ENCOUNTER — Ambulatory Visit (INDEPENDENT_AMBULATORY_CARE_PROVIDER_SITE_OTHER): Payer: Medicare Other | Admitting: Physician Assistant

## 2020-10-08 ENCOUNTER — Encounter: Payer: Self-pay | Admitting: Physician Assistant

## 2020-10-08 ENCOUNTER — Ambulatory Visit (INDEPENDENT_AMBULATORY_CARE_PROVIDER_SITE_OTHER)
Admission: RE | Admit: 2020-10-08 | Discharge: 2020-10-08 | Disposition: A | Discharge status: Home / Self Care | Payer: Medicare Other | Source: Ambulatory Visit | Attending: Physician Assistant | Admitting: Physician Assistant

## 2020-10-08 ENCOUNTER — Other Ambulatory Visit: Payer: Self-pay

## 2020-10-08 VITALS — BP 110/66 | HR 74 | Temp 98.2°F | Ht 68.0 in | Wt 220.0 lb

## 2020-10-08 DIAGNOSIS — R160 Hepatomegaly, not elsewhere classified: Secondary | ICD-10-CM

## 2020-10-08 DIAGNOSIS — F209 Schizophrenia, unspecified: Secondary | ICD-10-CM

## 2020-10-08 DIAGNOSIS — Z13220 Encounter for screening for lipoid disorders: Secondary | ICD-10-CM

## 2020-10-08 DIAGNOSIS — R062 Wheezing: Secondary | ICD-10-CM

## 2020-10-08 DIAGNOSIS — F17210 Nicotine dependence, cigarettes, uncomplicated: Secondary | ICD-10-CM

## 2020-10-08 DIAGNOSIS — Z131 Encounter for screening for diabetes mellitus: Secondary | ICD-10-CM | POA: Diagnosis not present

## 2020-10-08 DIAGNOSIS — Z0000 Encounter for general adult medical examination without abnormal findings: Secondary | ICD-10-CM | POA: Diagnosis not present

## 2020-10-08 LAB — COMPREHENSIVE METABOLIC PANEL
ALT: 13 U/L (ref 0–53)
AST: 14 U/L (ref 0–37)
Albumin: 4.4 g/dL (ref 3.5–5.2)
Alkaline Phosphatase: 68 U/L (ref 39–117)
BUN: 15 mg/dL (ref 6–23)
CO2: 25 mEq/L (ref 19–32)
Calcium: 9.8 mg/dL (ref 8.4–10.5)
Chloride: 107 mEq/L (ref 96–112)
Creatinine, Ser: 0.9 mg/dL (ref 0.40–1.50)
GFR: 104.41 mL/min (ref >60.00)
Glucose, Bld: 75 mg/dL (ref 70–99)
Potassium: 5.1 mEq/L (ref 3.5–5.1)
Sodium: 139 mEq/L (ref 135–145)
Total Bilirubin: 0.6 mg/dL (ref 0.2–1.2)
Total Protein: 6.2 g/dL (ref 6.0–8.3)

## 2020-10-08 LAB — CBC WITH DIFFERENTIAL/PLATELET
Basophils Absolute: 0.1 10*3/uL (ref 0.0–0.1)
Basophils Relative: 0.7 % (ref 0.0–3.0)
Eosinophils Absolute: 0.3 10*3/uL (ref 0.0–0.7)
Eosinophils Relative: 3.4 % (ref 0.0–5.0)
HCT: 47.4 % (ref 39.0–52.0)
Hemoglobin: 15.9 g/dL (ref 13.0–17.0)
Lymphocytes Relative: 14.5 % (ref 12.0–46.0)
Lymphs Abs: 1.1 10*3/uL (ref 0.7–4.0)
MCHC: 33.7 g/dL (ref 30.0–36.0)
MCV: 94.3 fl (ref 78.0–100.0)
Monocytes Absolute: 1.3 10*3/uL — ABNORMAL HIGH (ref 0.1–1.0)
Monocytes Relative: 16.7 % — ABNORMAL HIGH (ref 3.0–12.0)
Neutro Abs: 5.1 10*3/uL (ref 1.4–7.7)
Neutrophils Relative %: 64.7 % (ref 43.0–77.0)
Platelets: 215 10*3/uL (ref 150.0–400.0)
RBC: 5.02 Mil/uL (ref 4.22–5.81)
RDW: 13.6 % (ref 11.5–15.5)
WBC: 7.9 10*3/uL (ref 4.0–10.5)

## 2020-10-08 LAB — LIPID PANEL
Cholesterol: 195 mg/dL (ref 0–200)
HDL: 51.9 mg/dL (ref >39.00)
LDL Cholesterol: 124 mg/dL — ABNORMAL HIGH (ref 0–99)
NonHDL: 143.04
Total CHOL/HDL Ratio: 4
Triglycerides: 93 mg/dL (ref 0.0–149.0)
VLDL: 18.6 mg/dL (ref 0.0–40.0)

## 2020-10-08 LAB — TSH: TSH: 1.61 u[IU]/mL (ref 0.35–5.50)

## 2020-10-08 MED ORDER — BUDESONIDE-FORMOTEROL FUMARATE 80-4.5 MCG/ACT IN AERO: 2.0000 | INHALATION_SPRAY | Freq: Two times a day (BID) | RESPIRATORY_TRACT | 12 refills | Status: DC

## 2020-10-08 MED ORDER — ALBUTEROL SULFATE HFA 108 (90 BASE) MCG/ACT IN AERS: 2.0000 | INHALATION_SPRAY | Freq: Four times a day (QID) | RESPIRATORY_TRACT | 2 refills | Status: DC | PRN

## 2020-10-08 NOTE — Patient Instructions (Addendum)
Good to meet you today! Please go to the lab for blood work and I will call with results.  See handout of location for Chest XRAY. Someone will call to schedule [place] right upper quadrant.  You need to quit smoking! Let me know how I can help.

## 2020-10-08 NOTE — Progress Notes (Signed)
Subjective:    Patient ID: Tyler Mata, male    DOB: 05-06-1975, 45 y.o.   MRN: [id number]  Chief Complaint  Patient presents with   Establish Care   Annual Exam    HPI Patient is in today for new patient establishment. He is here with his mother, who is guardian.   Psych Hx: Schizophrenia - Diagnosed around age 56. Started taking Invega in January 2022. Sees [facility]. More social, taking better care of himself. Spending time with parents on the weekends and recently traveled to [place] to visit family.   Health maintenance: Lifestyle/ exercise: [employer] once weekly, occasionally plays basketball Nutrition: Mom grocery shops with him. Microwave meals. Mental health: Schizophrenia, stable. Caffeine: Loves sodas Sleep: Rarely needs Trazodone, Invega seems to be helping the most  Substance use: Current every day smoker 2 - 2.5 ppd, started around 18 Sexual activity: None  Immunizations: Flu shot recommended, he declines  Past Medical History:  Diagnosis Date   Pneumonia    Schizophrenia (HCC)     Past Surgical History:  Procedure Laterality Date   NO PAST SURGERIES     TOOTH EXTRACTION N/A 06/15/2020   Procedure: DENTAL RESTORATION/EXTRACTIONS;  Surgeon: Ocie Doyne, DMD;  Location: [facility] OR;  Service: Oral Surgery;  Laterality: N/A;    History reviewed. No pertinent family history.  Social History   Tobacco Use   Smoking status: Every Day    Packs/day: 2.00    Years: 28.00    Pack years: 56.00    Types: Cigarettes   Smokeless tobacco: Never  Vaping Use   Vaping Use: Every day  Substance Use Topics   Alcohol use: Yes     No Known Allergies  Review of Systems  Constitutional:  Negative for activity change, appetite change and unexpected weight change.  HENT:  Negative for congestion.   Eyes:  Negative for visual disturbance.  Respiratory:  Positive for cough (mornings) and wheezing. Negative for apnea.   Cardiovascular:  Negative for chest pain.   Gastrointestinal:  Negative for abdominal pain and blood in stool.  Endocrine: Negative for polydipsia, polyphagia and polyuria.  Genitourinary:  Negative for difficulty urinating.  Musculoskeletal:  Negative for arthralgias.  Skin:  Negative for rash.  Neurological:  Negative for seizures, weakness and headaches.  Psychiatric/Behavioral:  Positive for sleep disturbance (occasionally). Negative for suicidal ideas.       Objective:     BP 110/66   Pulse 74   Temp 98.2 F (36.8 C)   Ht 5\' 8"  (1.727 m)   Wt 220 lb (99.8 kg)   SpO2 98%   BMI 33.45 kg/m   Wt Readings from Last 3 Encounters:  10/08/20 220 lb (99.8 kg)  06/15/20 181 lb 12.8 oz (82.5 kg)  01/08/18 160 lb (72.6 kg)    BP Readings from Last 3 Encounters:  10/08/20 110/66  06/15/20 (!) 130/98  01/19/18 (!) 140/91     Physical Exam Vitals and nursing note reviewed.  Constitutional:      General: He is not in acute distress.    Appearance: Normal appearance. He is not toxic-appearing.  HENT:     Head: Normocephalic and atraumatic.     Right Ear: Tympanic membrane, ear canal and external ear normal.     Left Ear: Tympanic membrane, ear canal and external ear normal.     Nose: Nose normal.     Mouth/Throat:     Mouth: Mucous membranes are moist.     Dentition: Has  dentures.     Pharynx: Oropharynx is clear.  Eyes:     Extraocular Movements: Extraocular movements intact.     Conjunctiva/sclera: Conjunctivae normal.     Pupils: Pupils are equal, round, and reactive to light.  Cardiovascular:     Rate and Rhythm: Normal rate and regular rhythm.     Pulses: Normal pulses.     Heart sounds: Normal heart sounds.  Pulmonary:     Effort: Pulmonary effort is normal.     Breath sounds: Wheezing and rhonchi present.  Abdominal:     General: Abdomen is flat. Bowel sounds are normal.     Palpations: Abdomen is soft.     Tenderness: There is no abdominal tenderness.  Musculoskeletal:        General: Normal range  of motion.     Cervical back: Normal range of motion and neck supple.  Skin:    General: Skin is warm and dry.  Neurological:     General: No focal deficit present.     Mental Status: He is alert and oriented to person, place, and time.  Psychiatric:        Mood and Affect: Mood normal.        Behavior: Behavior normal.       Assessment & Plan:   Problem List Items Addressed This Visit   None Visit Diagnoses     Encounter for annual physical exam    -  Primary   Relevant Orders   CBC with Differential/Platelet (Completed)   Comprehensive metabolic panel (Completed)   Lipid panel (Completed)   TSH (Completed)   Schizophrenia, unspecified type (HCC)       Relevant Orders   CBC with Differential/Platelet (Completed)   Comprehensive metabolic panel (Completed)   Lipid panel (Completed)   TSH (Completed)   Cigarette nicotine dependence without complication       Relevant Orders   DG Chest 2 View   Wheezing       Relevant Orders   DG Chest 2 View   Hypodense mass of right lobe of liver       Relevant Orders   US Abdomen Limited RUQ (LIVER/GB)   Diabetes mellitus screening       Relevant Orders   Comprehensive metabolic panel (Completed)   Screening for cholesterol level       Relevant Orders   Lipid panel (Completed)       1. Encounter for annual physical exam 2. Diabetes mellitus screening 3. Screening for cholesterol level Age-appropriate screening and counseling performed today. Will check labs and call with results. Preventive measures discussed and printed in AVS for patient.    4. Schizophrenia, unspecified type (HCC) Doing very well with Invega. Continues to see [facility].  5. Cigarette nicotine dependence without complication 6. Wheezing -Equivalent of 56 pack year history. Severe. -Needs to stop ASAP and counseled him on this today. He has never quit. He will consider it. -CXR at [facility], will call with results. Suspect level of COPD given  his lung sounds on exam.  -Rx for Symbicort to use daily and rescue inhaler to use as needed.  7. Hypodense mass of right lobe of liver -Noted on CT abdomen/pelvis from 01-08-2018 as I reviewed his chart today. Will check RUQ [place] to ensure this has not changed from the 5 mm nonspecific hypodensity in right hepatic lobe.    This note was prepared with assistance of Conservation officer, historic buildings. Occasional wrong-word or sound-a-like substitutions may have  occurred due to the inherent limitations of voice recognition software.  Time Spent: 48 minutes of total time was spent on the date of the encounter performing the following actions: chart review prior to seeing the patient, obtaining history, performing a medically necessary exam, counseling on the treatment plan, placing orders, and documenting in our EHR.    Cale Decarolis M Waylynn Benefiel, PA-C

## 2020-10-09 ENCOUNTER — Ambulatory Visit
Admission: RE | Admit: 2020-10-09 | Discharge: 2020-10-09 | Disposition: A | Discharge status: Home / Self Care | Payer: Medicare HMO | Source: Ambulatory Visit | Attending: Physician Assistant | Admitting: Physician Assistant

## 2020-10-09 DIAGNOSIS — R160 Hepatomegaly, not elsewhere classified: Secondary | ICD-10-CM

## 2020-10-11 ENCOUNTER — Telehealth: Payer: Self-pay

## 2020-10-11 ENCOUNTER — Other Ambulatory Visit: Payer: Self-pay

## 2020-10-11 DIAGNOSIS — R21 Rash and other nonspecific skin eruption: Secondary | ICD-10-CM

## 2020-10-11 NOTE — Telephone Encounter (Signed)
Mom called in stating [facility] was to refer patient to a dermatologist for two spots on his face.  They did look, and found Dr. Rocky Link with [facility] who can get the patient in next week.    The office however does need a referral sent over to them.  Can a referral be placed as soon as possible.    Can reach office at [phone] Address:  [address]   Please follow back up with mom as soon as referral has been sent.  I am CC. Misty Stanley on this phone note as an Financial planner.  Thanks!

## 2020-10-11 NOTE — Telephone Encounter (Signed)
Referral Placed 

## 2020-10-11 NOTE — Telephone Encounter (Signed)
FYI  Patient's mom called in.  I have give her [facility]'s response to labs.    She understood.

## 2020-10-11 NOTE — Addendum Note (Signed)
Addended by: Ronelle Nigh on: 10/11/2020 03:45 PM   Modules accepted: Orders

## 2021-02-15 ENCOUNTER — Telehealth: Payer: Self-pay | Admitting: Physician Assistant

## 2021-02-15 NOTE — Telephone Encounter (Signed)
Patient is requesting that Alyssa prescribe a Nicotene patch to help him to stop smoking. Please advise pt at [phone]. Pt wants patches asap.

## 2021-02-18 NOTE — Telephone Encounter (Signed)
Patient mother called back in and states he smokes about 2 packs a day.  Patient mother would like it sent to  [id number] [place], [place] - [address] AT [facility] [address] Phone:  [phone]  Fax:  [phone]

## 2021-02-19 NOTE — Telephone Encounter (Addendum)
Patients mother, Margart Sickles has called back in regard.  She would like to know if script is going to be sent.  Also, would like to know if you step down on the patches?  States patient is almost out of patches and afraid he will start back smoking again.  Please give her a call back at [phone] with a status update.

## 2021-02-19 NOTE — Telephone Encounter (Signed)
Message sent via my chart

## 2021-02-20 ENCOUNTER — Other Ambulatory Visit: Payer: Self-pay | Admitting: Physician Assistant

## 2021-02-20 MED ORDER — NICOTINE 21 MG/24HR TD PT24: 21.0000 mg | MEDICATED_PATCH | Freq: Every day | TRANSDERMAL | 1 refills | Status: AC

## 2021-02-21 NOTE — Telephone Encounter (Signed)
Noted patient aware

## 2021-03-22 ENCOUNTER — Encounter: Payer: Self-pay | Admitting: Physician Assistant

## 2021-03-22 ENCOUNTER — Telehealth (INDEPENDENT_AMBULATORY_CARE_PROVIDER_SITE_OTHER): Payer: Medicare Other | Admitting: Physician Assistant

## 2021-03-22 DIAGNOSIS — F17210 Nicotine dependence, cigarettes, uncomplicated: Secondary | ICD-10-CM

## 2021-03-22 MED ORDER — NICOTINE 21 MG/24HR TD PT24: 21.0000 mg | MEDICATED_PATCH | Freq: Every day | TRANSDERMAL | 0 refills | Status: AC

## 2021-03-22 MED ORDER — NICOTINE 7 MG/24HR TD PT24: 7.0000 mg | MEDICATED_PATCH | Freq: Every day | TRANSDERMAL | 0 refills | Status: AC

## 2021-03-22 MED ORDER — NICOTINE 14 MG/24HR TD PT24: 14.0000 mg | MEDICATED_PATCH | Freq: Every day | TRANSDERMAL | 0 refills | Status: AC

## 2021-03-22 NOTE — Progress Notes (Signed)
? ?  Virtual Visit via Video Note ? ?I connected with  Tyler Mata  on 03/22/21 at 11:30 AM EDT by a video enabled telemedicine application and verified that I am speaking with the correct person using two identifiers. ? ?Location: ?Patient: home ?Provider: Nature conservation officer at [employer] ?Persons present: Patient, patient's mother, and myself ?  ?I discussed the limitations of evaluation and management by telemedicine and the availability of in person appointments. The patient expressed understanding and agreed to proceed.  ? ?History of Present Illness: ? ?F/up on smoking cessation. Has been using nicotine patches.  ?Used to smoke more than 2 packs per day. Still on 21 mg dose currently. ?Gave mom his money so he couldn't buy the cigarettes as well. ?Having a few cravings intermittently and had some dreams, otherwise has been doing well. ?No SOB or CP or other concerns.  ? ? ?Observations/Objective: ? ? ?Gen: Awake, alert, no acute distress ?Resp: Breathing is even and non-labored ?Psych: calm/pleasant demeanor ?Neuro: Alert and Oriented x 3, + facial symmetry, speech is clear. ? ? ?Assessment and Plan: ? ?1. Cigarette nicotine dependence without complication ?Congratulated the patient on working through the process of smoking cessation.  He will do another 2 weeks of the 21 mg dose, then 2 weeks 14 mg, then 2 weeks 7 mg dose, then off. Rx's sent for pt today. Pt and mom to keep me updated on progress. Plan for f/up CPE in October this year. Knows to call sooner if any concerns.  ? ? ?Follow Up Instructions: ? ?  ?I discussed the assessment and treatment plan with the patient. The patient was provided an opportunity to ask questions and all were answered. The patient agreed with the plan and demonstrated an understanding of the instructions. ?  ?The patient was advised to call back or seek an in-person evaluation if the symptoms worsen or if the condition fails to improve as anticipated. ? ?Everitt Wenner  M Eran Windish, PA-C ? ?

## 2021-05-10 ENCOUNTER — Ambulatory Visit (INDEPENDENT_AMBULATORY_CARE_PROVIDER_SITE_OTHER): Payer: Medicare Other

## 2021-05-10 DIAGNOSIS — Z0000 Encounter for general adult medical examination without abnormal findings: Secondary | ICD-10-CM

## 2021-05-10 NOTE — Patient Instructions (Addendum)
[name] , ?Thank you for taking time to come for your Medicare Wellness Visit. I appreciate your ongoing commitment to your health goals. Please review the following plan we discussed and let me know if I can assist you in the future.  ? ?Screening recommendations/referrals: ? ?Recommended yearly ophthalmology/optometry visit for glaucoma screening and checkup ?Recommended yearly dental visit for hygiene and checkup ? ?Vaccinations: ?Influenza vaccine: Declined  ?Tdap vaccine: Due  ?Covid-19: Declined  ? ?Advanced directives: Advance directive discussed with you today. Even though you declined this today please call our office should you change your mind and we can give you the proper paperwork for you to fill out. ? ?Conditions/risks identified: none at this time  ? ?Next appointment: Follow up in one year for your annual wellness visit  ? ?Preventive Care 40-64 Years, Male ?Preventive care refers to lifestyle choices and visits with your health care provider that can promote health and wellness. ?What does preventive care include? ?A yearly physical exam. This is also called an annual well check. ?Dental exams once or twice a year. ?Routine eye exams. Ask your health care provider how often you should have your eyes checked. ?Personal lifestyle choices, including: ?Daily care of your teeth and gums. ?Regular physical activity. ?Eating a healthy diet. ?Avoiding tobacco and drug use. ?Limiting alcohol use. ?Practicing safe sex. ?Taking low-dose aspirin every day starting at age 45. ?What happens during an annual well check? ?The services and screenings done by your health care provider during your annual well check will depend on your age, overall health, lifestyle risk factors, and family history of disease. ?Counseling  ?Your health care provider may ask you questions about your: ?Alcohol use. ?Tobacco use. ?Drug use. ?Emotional well-being. ?Home and relationship well-being. ?Sexual activity. ?Eating  habits. ?Work and work Astronomerenvironment. ?Screening  ?You may have the following tests or measurements: ?Height, weight, and BMI. ?Blood pressure. ?Lipid and cholesterol levels. These may be checked every 5 years, or more frequently if you are over 45 years old. ?Skin check. ?Lung cancer screening. You may have this screening every year starting at age 45 if you have a 30-pack-year history of smoking and currently smoke or have quit within the past 15 years. ?Fecal occult blood test (FOBT) of the stool. You may have this test every year starting at age 10250. ?Flexible sigmoidoscopy or colonoscopy. You may have a sigmoidoscopy every 5 years or a colonoscopy every 10 years starting at age 45. ?Prostate cancer screening. Recommendations will vary depending on your family history and other risks. ?Hepatitis C blood test. ?Hepatitis B blood test. ?Sexually transmitted disease (STD) testing. ?Diabetes screening. This is done by checking your blood sugar (glucose) after you have not eaten for a while (fasting). You may have this done every 1-3 years. ?Discuss your test results, treatment options, and if necessary, the need for more tests with your health care provider. ?Vaccines  ?Your health care provider may recommend certain vaccines, such as: ?Influenza vaccine. This is recommended every year. ?Tetanus, diphtheria, and acellular pertussis (Tdap, Td) vaccine. You may need a Td booster every 10 years. ?Zoster vaccine. You may need this after age 45. ?Pneumococcal 13-valent conjugate (PCV13) vaccine. You may need this if you have certain conditions and have not been vaccinated. ?Pneumococcal polysaccharide (PPSV23) vaccine. You may need one or two doses if you smoke cigarettes or if you have certain conditions. ?Talk to your health care provider about which screenings and vaccines you need and how often you need  them. ?This information is not intended to replace advice given to you by your health care provider. Make sure you  discuss any questions you have with your health care provider. ?Document Released: 01/19/2015 Document Revised: 09/12/2015 Document Reviewed: 10/24/2014 ?Elsevier Interactive Patient Education ? 2017 Elsevier Inc. ? ?Fall Prevention in the Home ?Falls can cause injuries. They can happen to people of all ages. There are many things you can do to make your home safe and to help prevent falls. ?What can I do on the outside of my home? ?Regularly fix the edges of walkways and driveways and fix any cracks. ?Remove anything that might make you trip as you walk through a door, such as a raised step or threshold. ?Trim any bushes or trees on the path to your home. ?Use bright outdoor lighting. ?Clear any walking paths of anything that might make someone trip, such as rocks or tools. ?Regularly check to see if handrails are loose or broken. Make sure that both sides of any steps have handrails. ?Any raised decks and porches should have guardrails on the edges. ?Have any leaves, snow, or ice cleared regularly. ?Use sand or salt on walking paths during winter. ?Clean up any spills in your garage right away. This includes oil or grease spills. ?What can I do in the bathroom? ?Use night lights. ?Install grab bars by the toilet and in the tub and shower. Do not use towel bars as grab bars. ?Use non-skid mats or decals in the tub or shower. ?If you need to sit down in the shower, use a plastic, non-slip stool. ?Keep the floor dry. Clean up any water that spills on the floor as soon as it happens. ?Remove soap buildup in the tub or shower regularly. ?Attach bath mats securely with double-sided non-slip rug tape. ?Do not have throw rugs and other things on the floor that can make you trip. ?What can I do in the bedroom? ?Use night lights. ?Make sure that you have a light by your bed that is easy to reach. ?Do not use any sheets or blankets that are too big for your bed. They should not hang down onto the floor. ?Have a firm chair  that has side arms. You can use this for support while you get dressed. ?Do not have throw rugs and other things on the floor that can make you trip. ?What can I do in the kitchen? ?Clean up any spills right away. ?Avoid walking on wet floors. ?Keep items that you use a lot in easy-to-reach places. ?If you need to reach something above you, use a strong step stool that has a grab bar. ?Keep electrical cords out of the way. ?Do not use floor polish or wax that makes floors slippery. If you must use wax, use non-skid floor wax. ?Do not have throw rugs and other things on the floor that can make you trip. ?What can I do with my stairs? ?Do not leave any items on the stairs. ?Make sure that there are handrails on both sides of the stairs and use them. Fix handrails that are broken or loose. Make sure that handrails are as long as the stairways. ?Check any carpeting to make sure that it is firmly attached to the stairs. Fix any carpet that is loose or worn. ?Avoid having throw rugs at the top or bottom of the stairs. If you do have throw rugs, attach them to the floor with carpet tape. ?Make sure that you have a light switch at  the top of the stairs and the bottom of the stairs. If you do not have them, ask someone to add them for you. ?What else can I do to help prevent falls? ?Wear shoes that: ?Do not have high heels. ?Have rubber bottoms. ?Are comfortable and fit you well. ?Are closed at the toe. Do not wear sandals. ?If you use a stepladder: ?Make sure that it is fully opened. Do not climb a closed stepladder. ?Make sure that both sides of the stepladder are locked into place. ?Ask someone to hold it for you, if possible. ?Clearly mark and make sure that you can see: ?Any grab bars or handrails. ?First and last steps. ?Where the edge of each step is. ?Use tools that help you move around (mobility aids) if they are needed. These include: ?Canes. ?Walkers. ?Scooters. ?Crutches. ?Turn on the lights when you go into a  dark area. Replace any light bulbs as soon as they burn out. ?Set up your furniture so you have a clear path. Avoid moving your furniture around. ?If any of your floors are uneven, fix them. ?If there are any pets aroun

## 2021-05-10 NOTE — Progress Notes (Signed)
Virtual Visit via Telephone Note ? ?I connected with  Tyler Mata on 05/10/21 at  2:15 PM EDT by telephone and verified that I am speaking with the correct person using two identifiers. ? ?Medicare Annual Wellness visit completed telephonically due to Covid-19 pandemic.  ? ?Persons participating in this call: This Health Coach and this patient.  ? ?Location: ?Patient: Home ?Provider: Office  ?  ?I discussed the limitations, risks, security and privacy concerns of performing an evaluation and management service by telephone and the availability of in person appointments. The patient expressed understanding and agreed to proceed. ? ?Unable to perform video visit due to video visit attempted and failed and/or patient does not have video capability.  ? ?Some vital signs may be absent or patient reported.  ? ?Marzella Schleinina H Noma Quijas, LPN ? ? ?Subjective:  ? Tyler Mata is a 46 y.o. male who presents for an Initial Medicare Annual Wellness Visit. ? ?Review of Systems    ? ?Cardiac Risk Factors include: male gender;obesity (BMI >30kg/m2) ? ?   ?Objective:  ?  ?There were no vitals filed for this visit. ?There is no height or weight on file to calculate BMI. ? ? ?  05/10/2021  ?  2:20 PM 06/15/2020  ?  6:41 AM 01/09/2018  ?  2:32 PM 01/08/2018  ?  8:12 PM  ?Advanced Directives  ?Does Patient Have a Medical Advance Directive? No No  No  ?Would patient like information on creating a medical advance directive? No - Patient declined No - Patient declined No - Patient declined   ? ? ?Current Medications (verified) ?Outpatient Encounter Medications as of 05/10/2021  ?Medication Sig  ? albuterol (VENTOLIN HFA) 108 (90 Base) MCG/ACT inhaler Inhale 2 puffs into the lungs every 6 (six) hours as needed for wheezing or shortness of breath.  ? ibuprofen (ADVIL) 800 MG tablet Take 1 tablet (800 mg total) by mouth every 8 (eight) hours as needed.  ? paliperidone (INVEGA SUSTENNA) 234 MG/1.5ML SUSY injection 156 mg every 30 (thirty) days.  ?  traZODone (DESYREL) 50 MG tablet Take 50 mg by mouth at bedtime.  ? budesonide-formoterol (SYMBICORT) 80-4.5 MCG/ACT inhaler Inhale 2 puffs into the lungs in the morning and at bedtime. Rinse mouth after use.  ? ?No facility-administered encounter medications on file as of 05/10/2021.  ? ? ?Allergies (verified) ?Patient has no known allergies.  ? ?History: ?Past Medical History:  ?Diagnosis Date  ? Pneumonia   ? Schizophrenia (HCC)   ? ?Past Surgical History:  ?Procedure Laterality Date  ? NO PAST SURGERIES    ? TOOTH EXTRACTION N/A 06/15/2020  ? Procedure: DENTAL RESTORATION/EXTRACTIONS;  Surgeon: Ocie DoyneJensen, Scott, DMD;  [facility];  Service: Oral Surgery;  Laterality: N/A;  ? ?History reviewed. No pertinent family history. ?Social History  ? ?Socioeconomic History  ? Marital status: Single  ?  Spouse name: Not on file  ? Number of children: Not on file  ? Years of education: Not on file  ? Highest education level: Not on file  ?Occupational History  ? Not on file  ?Tobacco Use  ? Smoking status: Former  ?  Packs/day: 2.00  ?  Years: 28.00  ?  Pack years: 56.00  ?  Types: Cigarettes  ?  Quit date: 03/04/2021  ?  Years since quitting: 0.1  ? Smokeless tobacco: Never  ?Vaping Use  ? Vaping Use: Every day  ?Substance and Sexual Activity  ? Alcohol use: Yes  ? Drug use: Not  on file  ? Sexual activity: Not on file  ?Other Topics Concern  ? Not on file  ?Social History Narrative  ? ** Merged History Encounter **  ?    ? ?Social Determinants of Health  ? ?Financial Resource Strain: Low Risk   ? Difficulty of Paying Living Expenses: Not hard at all  ?Food Insecurity: No Food Insecurity  ? Worried About Programme researcher, broadcasting/film/video in the Last Year: Never true  ? Ran Out of Food in the Last Year: Never true  ?Transportation Needs: No Transportation Needs  ? Lack of Transportation (Medical): No  ? Lack of Transportation (Non-Medical): No  ?Physical Activity: Inactive  ? Days of Exercise per Week: 0 days  ? Minutes of Exercise per  Session: 0 min  ?Stress: No Stress Concern Present  ? Feeling of Stress : Not at all  ?Social Connections: Socially Isolated  ? Frequency of Communication with Friends and Family: More than three times a week  ? Frequency of Social Gatherings with Friends and Family: More than three times a week  ? Attends Religious Services: Never  ? Active Member of Clubs or Organizations: No  ? Attends Banker Meetings: Never  ? Marital Status: Never married  ? ? ?Tobacco Counseling ?Counseling given: Not Answered ? ? ?Clinical Intake: ? ?Pre-visit preparation completed: Yes ? ?Pain : No/denies pain ? ?  ? ?BMI - recorded: 33.46 ?Nutritional Status: BMI > 30  Obese ?Nutritional Risks: None ?Diabetes: No ? ?How often do you need to have someone help you when you read instructions, pamphlets, or other written materials from your doctor or pharmacy?: 1 - Never ? ?Diabetic?no ? ?Interpreter Needed?: No ? ?Information entered by :: Lanier Ensign, LPN ? ? ?Activities of Daily Living ? ?  05/10/2021  ?  2:21 PM 06/15/2020  ?  6:44 AM  ?In your present state of health, do you have any difficulty performing the following activities:  ?Hearing? 0 0  ?Vision? 0 0  ?Difficulty concentrating or making decisions? 0 0  ?Walking or climbing stairs? 0 0  ?Dressing or bathing? 0 0  ?Doing errands, shopping? 0   ?Preparing Food and eating ? N   ?Using the Toilet? N   ?In the past six months, have you accidently leaked urine? N   ?Do you have problems with loss of bowel control? N   ?Managing your Medications? N   ?Managing your Finances? N   ?Housekeeping or managing your Housekeeping? N   ? ? ?Patient Care Team: ?Allwardt, Crist Infante, PA-C as PCP - General (Physician Assistant) ? ?Indicate any recent Medical Services you may have received from other than [facility] providers in the past year (date may be approximate). ? ?   ?Assessment:  ? This is a routine wellness examination for Tyler Mata. ? ?Hearing/Vision screen ?Hearing Screening -  Comments:: Pt denies any hearing issues  ?Vision Screening - Comments:: Pt encouraged to follow up  ? ?Dietary issues and exercise activities discussed: ?Current Exercise Habits: The patient does not participate in regular exercise at present ? ? Goals Addressed   ? ?  ?  ?  ?  ? This Visit's Progress  ?  Patient Stated     ?  None at this time  ?  ? ?  ? ?Depression Screen ? ?  05/10/2021  ?  2:20 PM  ?PHQ 2/9 Scores  ?PHQ - 2 Score 0  ?  ?Fall Risk ? ?  05/10/2021  ?  2:21 PM  ?Fall Risk   ?Falls in the past year? 0  ?Number falls in past yr: 0  ?Injury with Fall? 0  ?Follow up Falls prevention discussed  ? ? ?FALL RISK PREVENTION PERTAINING TO THE HOME: ? ?Any stairs in or around the home? Yes  ?If so, are there any without handrails? No  ?Home free of loose throw rugs in walkways, pet beds, electrical cords, etc? Yes  ?Adequate lighting in your home to reduce risk of falls? Yes  ? ?ASSISTIVE DEVICES UTILIZED TO PREVENT FALLS: ? ?Life alert? No  ?Use of a cane, walker or w/c? No  ?Grab bars in the bathroom? No  ?Shower chair or bench in shower? No  ?Elevated toilet seat or a handicapped toilet? No  ? ?TIMED UP AND GO: ? ?Was the test performed? No .  ? ?Cognitive Function: ?  ?  ? ?  05/10/2021  ?  2:22 PM  ?6CIT Screen  ?What Year? 0 points  ?What month? 0 points  ?What time? 0 points  ?Count back from 20 0 points  ?Months in reverse 0 points  ?Repeat phrase 0 points  ?Total Score 0 points  ? ? ?Immunizations ? ?There is no immunization history on file for this patient. ? ?TDAP status: Due, Education has been provided regarding the importance of this vaccine. Advised may receive this vaccine at local pharmacy or Health Dept. Aware to provide a copy of the vaccination record if obtained from local pharmacy or Health Dept. Verbalized acceptance and understanding. ? ?Flu Vaccine status: Declined, Education has been provided regarding the importance of this vaccine but patient still declined. Advised may receive this  vaccine at local pharmacy or Health Dept. Aware to provide a copy of the vaccination record if obtained from local pharmacy or Health Dept. Verbalized acceptance and understanding. ? ? ? ?Covid-19 vaccine status: Declined

## 2021-09-30 ENCOUNTER — Encounter: Payer: Self-pay | Admitting: *Deleted

## 2021-12-04 ENCOUNTER — Emergency Department (HOSPITAL_COMMUNITY)
Admission: EM | Admit: 2021-12-04 | Discharge: 2021-12-04 | Disposition: A | Discharge status: Home / Self Care | Payer: Medicare Other | Attending: Emergency Medicine | Admitting: Emergency Medicine

## 2021-12-04 ENCOUNTER — Encounter (HOSPITAL_COMMUNITY): Payer: Self-pay | Admitting: Emergency Medicine

## 2021-12-04 DIAGNOSIS — G2561 Drug induced tics: Secondary | ICD-10-CM | POA: Diagnosis present

## 2021-12-04 NOTE — ED Provider Notes (Signed)
[facility] EMERGENCY DEPARTMENT Provider Note   CSN: [id number] Arrival date & time: 12/04/21  1623     History  Medication issue  Tyler Mata is a 46 y.o. male.  Patient here because he is having some excessive nose picking that he think is from his Haldol/Invega.  He denies any SI or HI.  He has follow-up with his psychiatrist on Friday but he was hoping maybe he can get a medication change today.  Denies any chest pain, nausea, vomiting  The history is provided by the patient.       Home Medications Prior to Admission medications   Medication Sig Start Date End Date Taking? Authorizing Provider  albuterol (VENTOLIN HFA) 108 (90 Base) MCG/ACT inhaler Inhale 2 puffs into the lungs every 6 (six) hours as needed for wheezing or shortness of breath. 10/08/20   Allwardt, Crist Infante, PA-C  budesonide-formoterol (SYMBICORT) 80-4.5 MCG/ACT inhaler Inhale 2 puffs into the lungs in the morning and at bedtime. Rinse mouth after use. 10/08/20 03/22/21  Allwardt, Crist Infante, PA-C  ibuprofen (ADVIL) 800 MG tablet Take 1 tablet (800 mg total) by mouth every 8 (eight) hours as needed. 06/15/20   Ocie Doyne, DMD  paliperidone (INVEGA SUSTENNA) 234 MG/1.5ML SUSY injection 156 mg every 30 (thirty) days.    [provider]  traZODone (DESYREL) 50 MG tablet Take 50 mg by mouth at bedtime.    [provider]      Allergies    Patient has no known allergies.    Review of Systems   Review of Systems  Physical Exam Updated Vital Signs BP (!) 151/95   Pulse (!) 104   Temp 97.9 F (36.6 C) (Oral)   Resp 16   Ht 5\' 8"  (1.727 m)   Wt 108.9 kg   SpO2 96%   BMI 36.49 kg/m  Physical Exam Vitals and nursing note reviewed.  Constitutional:      General: He is not in acute distress.    Appearance: He is well-developed.  HENT:     Head: Normocephalic and atraumatic.  Eyes:     Conjunctiva/sclera: Conjunctivae normal.  Cardiovascular:     Rate and Rhythm:  Normal rate and regular rhythm.     Heart sounds: No murmur heard. Pulmonary:     Effort: Pulmonary effort is normal. No respiratory distress.     Breath sounds: Normal breath sounds.  Abdominal:     Palpations: Abdomen is soft.     Tenderness: There is no abdominal tenderness.  Musculoskeletal:        General: No swelling.     Cervical back: Neck supple.  Skin:    General: Skin is warm and dry.     Capillary Refill: Capillary refill takes less than 2 seconds.  Neurological:     General: No focal deficit present.     Mental Status: He is alert.  Psychiatric:        Mood and Affect: Mood normal.        Behavior: Behavior normal.        Thought Content: Thought content normal.        Judgment: Judgment normal.     ED Results / Procedures / Treatments   Labs (all labs ordered are listed, but only abnormal results are displayed) Labs Reviewed - No data to display  EKG None  Radiology No results found.  Procedures Procedures    Medications Ordered in ED Medications - No data to display  ED Course/ Medical Decision Making/ A&P                           Medical Decision Making  Tyler Mata is here due to concern for psych medicine issue.  For the last couple years he has been on [place] that caused him to have some tics where he picks at his nose.  He has been doing that more recently.  Denies any suicidal homicidal ideation.  He has follow-up with his psych provider on Friday.  Overall he does not appear to be at harm for himself or other people.  He admits that he does not feel that he is going to harm himself or others.  He just was hoping that maybe he can get a medication change.  I think that this can be done outpatient.  He understands that plan.  Discharge.  This chart was dictated using voice recognition software.  Despite best efforts to proofread,  errors can occur which can change the documentation meaning.         Final Clinical Impression(s) / ED  Diagnoses Final diagnoses:  Tics, drug-induced    Rx / DC Orders ED Discharge Orders     None         Virgina Norfolk, DO 12/04/21 1715

## 2021-12-04 NOTE — ED Triage Notes (Signed)
Pt reports starting haldol last Thursday. States it has now made him start picking his nose and pulling nose hairs. States he has been hearing the same voice since he was 21. Denies SI or HI.

## 2021-12-04 NOTE — Discharge Instructions (Signed)
Keep your appointment with your psychiatrist on Friday.  If you feel like you need to be seen sooner by a behavioral health provider you can go to behavioral health center number provided.

## 2022-05-05 ENCOUNTER — Telehealth: Payer: Self-pay | Admitting: Physician Assistant

## 2022-05-05 NOTE — Telephone Encounter (Signed)
Copied from CRM [id number]. Topic: Medicare AWV >> May 05, 2022  9:24 AM Gwenith Spitz wrote: Reason for CRM: Called patient to REschedule Medicare Annual Wellness Visit (AWV). Left message for patient to call back and REschedule Medicare Annual Wellness Visit (AWV).  Last date of AWV: 05/10/2021  Please schedule an appointment at any time with Inetta Fermo, [facility]. Please schedule AWVS with Inetta Fermo, [facility].  If any questions, please contact me at [phone].  Thank you ,  Gabriel Cirri [facility] AWV TEAM Direct Dial [phone]

## 2022-05-06 ENCOUNTER — Telehealth: Payer: Self-pay | Admitting: Physician Assistant

## 2022-05-06 NOTE — Telephone Encounter (Signed)
Contacted Angelena Form to schedule their annual wellness visit. Appointment made for 05/19/2022.  Gabriel Cirri [facility] AWV TEAM Direct Dial [phone]

## 2022-05-19 ENCOUNTER — Ambulatory Visit (INDEPENDENT_AMBULATORY_CARE_PROVIDER_SITE_OTHER): Payer: Medicare Other

## 2022-05-19 VITALS — Wt 240.0 lb

## 2022-05-19 DIAGNOSIS — Z0000 Encounter for general adult medical examination without abnormal findings: Secondary | ICD-10-CM | POA: Diagnosis not present

## 2022-05-19 NOTE — Patient Instructions (Signed)
[name] , Thank you for taking time to come for your Medicare Wellness Visit. I appreciate your ongoing commitment to your health goals. Please review the following plan we discussed and let me know if I can assist you in the future.   These are the goals we discussed:  Goals      Patient Stated     None at this time      Patient Stated     None at this time         This is a list of the screening recommended for you and due dates:  Health Maintenance  Topic Date Due   COVID-19 Vaccine (1) Never done   Hepatitis C Screening: USPSTF Recommendation to screen - Ages 68-79 yo.  Never done   DTaP/Tdap/Td vaccine (1 - Tdap) Never done   Colon Cancer Screening  Never done   Flu Shot  08/07/2022   Medicare Annual Wellness Visit  05/19/2023   HIV Screening  Completed   HPV Vaccine  Aged Out    Advanced directives: Advance directive discussed with you today. Even though you declined this today please call our office should you change your mind and we can give you the proper paperwork for you to fill out.   Conditions/risks identified: none at this time   Next appointment: Follow up in one year for your annual wellness visit   Preventive Care 40-64 Years, Male Preventive care refers to lifestyle choices and visits with your health care provider that can promote health and wellness. What does preventive care include? A yearly physical exam. This is also called an annual well check. Dental exams once or twice a year. Routine eye exams. Ask your health care provider how often you should have your eyes checked. Personal lifestyle choices, including: Daily care of your teeth and gums. Regular physical activity. Eating a healthy diet. Avoiding tobacco and drug use. Limiting alcohol use. Practicing safe sex. Taking low-dose aspirin every day starting at age 44. What happens during an annual well check? The services and screenings done by your health care provider during your annual  well check will depend on your age, overall health, lifestyle risk factors, and family history of disease. Counseling  Your health care provider may ask you questions about your: Alcohol use. Tobacco use. Drug use. Emotional well-being. Home and relationship well-being. Sexual activity. Eating habits. Work and work Astronomer. Screening  You may have the following tests or measurements: Height, weight, and BMI. Blood pressure. Lipid and cholesterol levels. These may be checked every 5 years, or more frequently if you are over 52 years old. Skin check. Lung cancer screening. You may have this screening every year starting at age 38 if you have a 30-pack-year history of smoking and currently smoke or have quit within the past 15 years. Fecal occult blood test (FOBT) of the stool. You may have this test every year starting at age 55. Flexible sigmoidoscopy or colonoscopy. You may have a sigmoidoscopy every 5 years or a colonoscopy every 10 years starting at age 10. Prostate cancer screening. Recommendations will vary depending on your family history and other risks. Hepatitis C blood test. Hepatitis B blood test. Sexually transmitted disease (STD) testing. Diabetes screening. This is done by checking your blood sugar (glucose) after you have not eaten for a while (fasting). You may have this done every 1-3 years. Discuss your test results, treatment options, and if necessary, the need for more tests with your health care provider.  Vaccines  Your health care provider may recommend certain vaccines, such as: Influenza vaccine. This is recommended every year. Tetanus, diphtheria, and acellular pertussis (Tdap, Td) vaccine. You may need a Td booster every 10 years. Zoster vaccine. You may need this after age 15. Pneumococcal 13-valent conjugate (PCV13) vaccine. You may need this if you have certain conditions and have not been vaccinated. Pneumococcal polysaccharide (PPSV23) vaccine. You  may need one or two doses if you smoke cigarettes or if you have certain conditions. Talk to your health care provider about which screenings and vaccines you need and how often you need them. This information is not intended to replace advice given to you by your health care provider. Make sure you discuss any questions you have with your health care provider. Document Released: 01/19/2015 Document Revised: 09/12/2015 Document Reviewed: 10/24/2014 Elsevier Interactive Patient Education  2017 [employer].  Fall Prevention in the Home Falls can cause injuries. They can happen to people of all ages. There are many things you can do to make your home safe and to help prevent falls. What can I do on the outside of my home? Regularly fix the edges of walkways and driveways and fix any cracks. Remove anything that might make you trip as you walk through a door, such as a raised step or threshold. Trim any bushes or trees on the path to your home. Use bright outdoor lighting. Clear any walking paths of anything that might make someone trip, such as rocks or tools. Regularly check to see if handrails are loose or broken. Make sure that both sides of any steps have handrails. Any raised decks and porches should have guardrails on the edges. Have any leaves, snow, or ice cleared regularly. Use sand or salt on walking paths during winter. Clean up any spills in your garage right away. This includes oil or grease spills. What can I do in the bathroom? Use night lights. Install grab bars by the toilet and in the tub and shower. Do not use towel bars as grab bars. Use non-skid mats or decals in the tub or shower. If you need to sit down in the shower, use a plastic, non-slip stool. Keep the floor dry. Clean up any water that spills on the floor as soon as it happens. Remove soap buildup in the tub or shower regularly. Attach bath mats securely with double-sided non-slip rug tape. Do not have throw  rugs and other things on the floor that can make you trip. What can I do in the bedroom? Use night lights. Make sure that you have a light by your bed that is easy to reach. Do not use any sheets or blankets that are too big for your bed. They should not hang down onto the floor. Have a firm chair that has side arms. You can use this for support while you get dressed. Do not have throw rugs and other things on the floor that can make you trip. What can I do in the kitchen? Clean up any spills right away. Avoid walking on wet floors. Keep items that you use a lot in easy-to-reach places. If you need to reach something above you, use a strong step stool that has a grab bar. Keep electrical cords out of the way. Do not use floor polish or wax that makes floors slippery. If you must use wax, use non-skid floor wax. Do not have throw rugs and other things on the floor that can make you trip. What  can I do with my stairs? Do not leave any items on the stairs. Make sure that there are handrails on both sides of the stairs and use them. Fix handrails that are broken or loose. Make sure that handrails are as long as the stairways. Check any carpeting to make sure that it is firmly attached to the stairs. Fix any carpet that is loose or worn. Avoid having throw rugs at the top or bottom of the stairs. If you do have throw rugs, attach them to the floor with carpet tape. Make sure that you have a light switch at the top of the stairs and the bottom of the stairs. If you do not have them, ask someone to add them for you. What else can I do to help prevent falls? Wear shoes that: Do not have high heels. Have rubber bottoms. Are comfortable and fit you well. Are closed at the toe. Do not wear sandals. If you use a stepladder: Make sure that it is fully opened. Do not climb a closed stepladder. Make sure that both sides of the stepladder are locked into place. Ask someone to hold it for you, if  possible. Clearly mark and make sure that you can see: Any grab bars or handrails. First and last steps. Where the edge of each step is. Use tools that help you move around (mobility aids) if they are needed. These include: Canes. Walkers. Scooters. Crutches. Turn on the lights when you go into a dark area. Replace any light bulbs as soon as they burn out. Set up your furniture so you have a clear path. Avoid moving your furniture around. If any of your floors are uneven, fix them. If there are any pets around you, be aware of where they are. Review your medicines with your doctor. Some medicines can make you feel dizzy. This can increase your chance of falling. Ask your doctor what other things that you can do to help prevent falls. This information is not intended to replace advice given to you by your health care provider. Make sure you discuss any questions you have with your health care provider. Document Released: 10/19/2008 Document Revised: 05/31/2015 Document Reviewed: 01/27/2014 Elsevier Interactive Patient Education  2017 [employer].

## 2022-05-19 NOTE — Progress Notes (Addendum)
I connected with  Tyler Mata on 05/19/22 by a audio enabled telemedicine application and verified that I am speaking with the correct person using two identifiers.  Patient Location: Home  Provider Location: Office/Clinic  I discussed the limitations of evaluation and management by telemedicine. The patient expressed understanding and agreed to proceed.     Patient Medicare AWV questionnaire was completed by the patient on 05/19/22; I have confirmed that all information answered by patient is correct and no changes since this date.      Subjective:   Tyler Mata is a 47 y.o. male who presents for Medicare Annual/Subsequent preventive examination.  Review of Systems     Cardiac Risk Factors include: obesity (BMI >30kg/m2);male gender     Objective:    Today's Vitals   05/19/22 1435  Weight: 240 lb (108.9 kg)   Body mass index is 36.49 kg/m.     05/10/2021    2:20 PM 06/15/2020    6:41 AM 01/09/2018    2:32 PM 01/08/2018    8:12 PM  Advanced Directives  Does Patient Have a Medical Advance Directive? No No  No  Would patient like information on creating a medical advance directive? No - Patient declined No - Patient declined No - Patient declined     Current Medications (verified) Outpatient Encounter Medications as of 05/19/2022  Medication Sig   ibuprofen (ADVIL) 800 MG tablet Take 1 tablet (800 mg total) by mouth every 8 (eight) hours as needed.   paliperidone (INVEGA SUSTENNA) 234 MG/1.5ML SUSY injection 156 mg every 30 (thirty) days.   traZODone (DESYREL) 50 MG tablet Take 50 mg by mouth at bedtime.   budesonide-formoterol (SYMBICORT) 80-4.5 MCG/ACT inhaler Inhale 2 puffs into the lungs in the morning and at bedtime. Rinse mouth after use.   [DISCONTINUED] albuterol (VENTOLIN HFA) 108 (90 Base) MCG/ACT inhaler Inhale 2 puffs into the lungs every 6 (six) hours as needed for wheezing or shortness of breath.   No facility-administered encounter medications on file  as of 05/19/2022.    Allergies (verified) Patient has no known allergies.   History: Past Medical History:  Diagnosis Date   Pneumonia    Schizophrenia [facility])    Past Surgical History:  Procedure Laterality Date   NO PAST SURGERIES     TOOTH EXTRACTION N/A 06/15/2020   Procedure: DENTAL RESTORATION/EXTRACTIONS;  Surgeon: Ocie Doyne, DMD;  Location: [facility] OR;  Service: Oral Surgery;  Laterality: N/A;   History reviewed. No pertinent family history. Social History   Socioeconomic History   Marital status: Single    Spouse name: Not on file   Number of children: Not on file   Years of education: Not on file   Highest education level: Not on file  Occupational History   Not on file  Tobacco Use   Smoking status: Former    Packs/day: 2.00    Years: 28.00    Additional pack years: 0.00    Total pack years: 56.00    Types: Cigarettes    Quit date: 03/04/2021    Years since quitting: 1.2   Smokeless tobacco: Never  Vaping Use   Vaping Use: Former  Substance and Sexual Activity   Alcohol use: Yes   Drug use: Never   Sexual activity: Not Currently  Other Topics Concern   Not on file  Social History Narrative   ** Merged History Encounter **       Social Determinants of Health   Financial Resource Strain: Patient  Declined (05/19/2022)   Overall Financial Resource Strain (CARDIA)    Difficulty of Paying Living Expenses: Patient declined  Food Insecurity: Patient Declined (05/19/2022)   Hunger Vital Sign    Worried About Running Out of Food in the Last Year: Patient declined    Ran Out of Food in the Last Year: Patient declined  Transportation Needs: No Transportation Needs (05/19/2022)   PRAPARE - Administrator, Civil Service (Medical): No    Lack of Transportation (Non-Medical): No  Physical Activity: Insufficiently Active (05/19/2022)   Exercise Vital Sign    Days of Exercise per Week: 2 days    Minutes of Exercise per Session: 20 min  Stress: Patient  Declined (05/19/2022)   [employer] of Occupational Health - Occupational Stress Questionnaire    Feeling of Stress : Patient declined  Social Connections: Unknown (05/19/2022)   Social Connection and Isolation Panel [NHANES]    Frequency of Communication with Friends and Family: More than three times a week    Frequency of Social Gatherings with Friends and Family: Three times a week    Attends Religious Services: Never    Active Member of Clubs or Organizations: Yes    Attends Banker Meetings: Patient declined    Marital Status: Patient declined    Tobacco Counseling Counseling given: Not Answered   Clinical Intake:  Pre-visit preparation completed: Yes  Pain : No/denies pain     BMI - recorded: 36.49 Nutritional Status: BMI > 30  Obese Nutritional Risks: None Diabetes: No  How often do you need to have someone help you when you read instructions, pamphlets, or other written materials from your doctor or pharmacy?: 3 - Sometimes  Diabetic?no  Interpreter Needed?: No  Information entered by :: Lanier Ensign, LPN   Activities of Daily Living    05/19/2022   12:11 PM  In your present state of health, do you have any difficulty performing the following activities:  Hearing? 0  Vision? 0  Difficulty concentrating or making decisions? 0  Walking or climbing stairs? 0  Dressing or bathing? 0  Doing errands, shopping? 0  Preparing Food and eating ? N  Using the Toilet? N  In the past six months, have you accidently leaked urine? N  Do you have problems with loss of bowel control? N  Managing your Medications? N  Managing your Finances? N  Housekeeping or managing your Housekeeping? N    Patient Care Team: Allwardt, Crist Infante, PA-C as PCP - General (Physician Assistant)  Indicate any recent Medical Services you may have received from other than [facility] providers in the past year (date may be approximate).     Assessment:   This is a routine  wellness examination for Tyler Mata.  Hearing/Vision screen Hearing Screening - Comments:: Pt denies any hearing issues  Vision Screening - Comments:: Pt follows up with [facility] for annual e eye exams   Dietary issues and exercise activities discussed: Current Exercise Habits: Home exercise routine, Type of exercise: walking, Time (Minutes): 20, Frequency (Times/Week): 2, Weekly Exercise (Minutes/Week): 40   Goals Addressed             This Visit's Progress    Patient Stated       None at this time        Depression Screen    05/19/2022    2:38 PM 05/10/2021    2:20 PM  PHQ 2/9 Scores  PHQ - 2 Score 0 0  Fall Risk    05/19/2022   12:11 PM 05/10/2021    2:21 PM  Fall Risk   Falls in the past year? 0 0  Number falls in past yr:  0  Injury with Fall?  0  Risk for fall due to : Impaired vision   Follow up Falls prevention discussed Falls prevention discussed    FALL RISK PREVENTION PERTAINING TO THE HOME:  Any stairs in or around the home? Yes  If so, are there any without handrails? No  Home free of loose throw rugs in walkways, pet beds, electrical cords, etc? Yes  Adequate lighting in your home to reduce risk of falls? Yes   ASSISTIVE DEVICES UTILIZED TO PREVENT FALLS:  Life alert? No  Use of a cane, walker or w/c? No  Grab bars in the bathroom? No  Shower chair or bench in shower? No  Elevated toilet seat or a handicapped toilet? No   TIMED UP AND GO:  Was the test performed? No .   Cognitive Function:        05/19/2022    2:40 PM 05/10/2021    2:22 PM  6CIT Screen  What Year? 0 points 0 points  What month? 0 points 0 points  What time? 0 points 0 points  Count back from 20 0 points 0 points  Months in reverse 0 points 0 points  Repeat phrase 0 points 0 points  Total Score 0 points 0 points    Immunizations  There is no immunization history on file for this patient.  TDAP status: Due, Education has been provided regarding the importance of  this vaccine. Advised may receive this vaccine at local pharmacy or Health Dept. Aware to provide a copy of the vaccination record if obtained from local pharmacy or Health Dept. Verbalized acceptance and understanding.  Flu Vaccine status: Declined, Education has been provided regarding the importance of this vaccine but patient still declined. Advised may receive this vaccine at local pharmacy or Health Dept. Aware to provide a copy of the vaccination record if obtained from local pharmacy or Health Dept. Verbalized acceptance and understanding.    Covid-19 vaccine status: Declined, Education has been provided regarding the importance of this vaccine but patient still declined. Advised may receive this vaccine at local pharmacy or Health Dept.or vaccine clinic. Aware to provide a copy of the vaccination record if obtained from local pharmacy or Health Dept. Verbalized acceptance and understanding.  Qualifies for Shingles Vaccine? No      Screening Tests Health Maintenance  Topic Date Due   Hepatitis C Screening  Never done   DTaP/Tdap/Td (1 - Tdap) Never done   COVID-19 Vaccine (1) 06/04/2022 (Originally 06/28/1976)   COLONOSCOPY (Pts 45-65yrs Insurance coverage will need to be confirmed)  05/19/2023 (Originally 12/28/2021)   INFLUENZA VACCINE  08/07/2022   Medicare Annual Wellness (AWV)  05/19/2023   HIV Screening  Completed   HPV VACCINES  Aged Out    Health Maintenance  Health Maintenance Due  Topic Date Due   Hepatitis C Screening  Never done   DTaP/Tdap/Td (1 - Tdap) Never done    Pt declined colonoscopy at this time    Additional Screening:  Hepatitis C Screening: does qualify  Vision Screening: Recommended annual ophthalmology exams for early detection of glaucoma and other disorders of the eye. Is the patient up to date with their annual eye exam?  Yes  Who is the provider or what is the name of the office in  which the patient attends annual eye exams? [facility]   If pt is not established with a provider, would they like to be referred to a provider to establish care? No .   Dental Screening: Recommended annual dental exams for proper oral hygiene  Community Resource Referral / Chronic Care Management: CRR required this visit?  No   CCM required this visit?  No      Plan:     I have personally reviewed and noted the following in the patient's chart:   Medical and social history Use of alcohol, tobacco or illicit drugs  Current medications and supplements including opioid prescriptions. Patient is not currently taking opioid prescriptions. Functional ability and status Nutritional status Physical activity Advanced directives List of other physicians Hospitalizations, surgeries, and ER visits in previous 12 months Vitals Screenings to include cognitive, depression, and falls Referrals and appointments  In addition, I have reviewed and discussed with patient certain preventive protocols, quality metrics, and best practice recommendations. A written personalized care plan for preventive services as well as general preventive health recommendations were provided to patient.     Marzella Schlein, LPN   [id number]/61/[id number]   Nurse Notes: none

## 2022-05-24 ENCOUNTER — Other Ambulatory Visit: Payer: Self-pay | Admitting: Physician Assistant

## 2023-05-18 ENCOUNTER — Encounter (HOSPITAL_COMMUNITY): Payer: Self-pay

## 2023-05-25 ENCOUNTER — Ambulatory Visit: Payer: Medicare Other

## 2023-05-25 VITALS — Ht 68.0 in | Wt 235.0 lb

## 2023-05-25 DIAGNOSIS — Z0000 Encounter for general adult medical examination without abnormal findings: Secondary | ICD-10-CM | POA: Diagnosis not present

## 2023-05-25 DIAGNOSIS — Z1211 Encounter for screening for malignant neoplasm of colon: Secondary | ICD-10-CM | POA: Diagnosis not present

## 2023-05-25 NOTE — Patient Instructions (Addendum)
 Mr. Nill , Thank you for taking time out of your busy schedule to complete your Annual Wellness Visit with me. I enjoyed our conversation and look forward to speaking with you again next year. I, as well as your care team,  appreciate your ongoing commitment to your health goals. Please review the following plan we discussed and let me know if I can assist you in the future. Your Game plan/ To Do List    Referrals: If you haven't heard from the office you've been referred to, please reach out to them at the phone provided.   Follow up Visits: Next Medicare AWV with our clinical staff: 05/30/24 @ 1p   Have you seen your provider in the last 6 months (3 months if uncontrolled diabetes)? Yes 12/04/21 Next Office Visit with your provider:   Clinician Recommendations:  Aim for 30 minutes of exercise or brisk walking, 6-8 glasses of water, and 5 servings of fruits and vegetables each day.       This is a list of the screening recommended for you and due dates:  Health Maintenance  Topic Date Due   Hepatitis C Screening  Never done   DTaP/Tdap/Td vaccine (1 - Tdap) Never done   Colon Cancer Screening  Never done   COVID-19 Vaccine (1 - 2024-25 season) Never done   Flu Shot  08/07/2023   Medicare Annual Wellness Visit  05/24/2024   HIV Screening  Completed   HPV Vaccine  Aged Out   Meningitis B Vaccine  Aged Out    Advanced directives: (Declined) Advance directive discussed with you today. Even though you declined this today, please call our office should you change your mind, and we can give you the proper paperwork for you to fill out. Advance Care Planning is important because it:  [x]  Makes sure you receive the medical care that is consistent with your values, goals, and preferences  [x]  It provides guidance to your family and loved ones and reduces their decisional burden about whether or not they are making the right decisions based on your wishes.  Follow the link provided in your  after visit summary or read over the paperwork we have mailed to you to help you started getting your Advance Directives in place. If you need assistance in completing these, please reach out to us  so that we can help you!  See attachments for Preventive Care and Fall Prevention Tips.

## 2023-05-25 NOTE — Progress Notes (Signed)
 Subjective:   Tyler Mata is a 48 y.o. who presents for a Medicare Wellness preventive visit.  As a reminder, Annual Wellness Visits don't include a physical exam, and some assessments may be limited, especially if this visit is performed virtually. We may recommend an in-person follow-up visit with your provider if needed.  Visit Complete: Virtual I connected with  Tyler Mata on 05/25/23 by a audio enabled telemedicine application and verified that I am speaking with the correct person using two identifiers.  Patient Location: Home  Provider Location: Home Office  I discussed the limitations of evaluation and management by telemedicine. The patient expressed understanding and agreed to proceed.  Vital Signs: Because this visit was a virtual/telehealth visit, some criteria may be missing or patient reported. Any vitals not documented were not able to be obtained and vitals that have been documented are patient reported.    Persons Participating in Visit: Patient.  AWV Questionnaire: No: Patient Medicare AWV questionnaire was not completed prior to this visit.  Cardiac Risk Factors include: advanced age (>61men, >35 women);male gender     Objective:     Today's Vitals   05/25/23 1416  Weight: 235 lb (106.6 kg)  Height: 5\' 8"  (1.727 m)   Body mass index is 35.73 kg/m.     05/25/2023    2:22 PM 05/10/2021    2:20 PM 06/15/2020    6:41 AM 01/09/2018    2:32 PM 01/08/2018    8:12 PM  Advanced Directives  Does Patient Have a Medical Advance Directive? No No No  No  Would patient like information on creating a medical advance directive? No - Patient declined No - Patient declined No - Patient declined No - Patient declined     Current Medications (verified) Outpatient Encounter Medications as of 05/25/2023  Medication Sig   ibuprofen  (ADVIL ) 800 MG tablet Take 1 tablet (800 mg total) by mouth every 8 (eight) hours as needed.   paliperidone (INVEGA SUSTENNA) 234  MG/1.5ML SUSY injection 156 mg every 30 (thirty) days.   SYMBICORT  80-4.5 MCG/ACT inhaler INHALE 2 PUFFS INTO THE LUNGS IN THE MORNING AND AT BEDTIME. RINSE MOUTH AFTER USE.   traZODone (DESYREL) 50 MG tablet Take 50 mg by mouth at bedtime.   No facility-administered encounter medications on file as of 05/25/2023.    Allergies (verified) Patient has no known allergies.   History: Past Medical History:  Diagnosis Date   Pneumonia    Schizophrenia [facility])    Past Surgical History:  Procedure Laterality Date   NO PAST SURGERIES     TOOTH EXTRACTION N/A 06/15/2020   Procedure: DENTAL RESTORATION/EXTRACTIONS;  Surgeon: Ascencion Lava, DMD;  Location: [facility] OR;  Service: Oral Surgery;  Laterality: N/A;   History reviewed. No pertinent family history. Social History   Socioeconomic History   Marital status: Single    Spouse name: Not on file   Number of children: Not on file   Years of education: Not on file   Highest education level: Not on file  Occupational History   Not on file  Tobacco Use   Smoking status: Former    Current packs/day: 0.00    Average packs/day: 2.0 packs/day for 28.0 years (56.0 ttl pk-yrs)    Types: Cigarettes    Start date: 03/04/1993    Quit date: 03/04/2021    Years since quitting: 2.2   Smokeless tobacco: Never  Vaping Use   Vaping status: Former  Substance and Sexual Activity  Alcohol use: Yes   Drug use: Never   Sexual activity: Not Currently  Other Topics Concern   Not on file  Social History Narrative   ** Merged History Encounter **       Social Drivers of Health   Financial Resource Strain: Low Risk  (05/25/2023)   Overall Financial Resource Strain (CARDIA)    Difficulty of Paying Living Expenses: Not hard at all  Food Insecurity: No Food Insecurity (05/25/2023)   Hunger Vital Sign    Worried About Running Out of Food in the Last Year: Never true    Ran Out of Food in the Last Year: Never true  Transportation Needs: No Transportation  Needs (05/25/2023)   PRAPARE - Administrator, Civil Service (Medical): No    Lack of Transportation (Non-Medical): No  Physical Activity: Inactive (05/25/2023)   Exercise Vital Sign    Days of Exercise per Week: 0 days    Minutes of Exercise per Session: 0 min  Stress: No Stress Concern Present (05/25/2023)   [employer] of Occupational Health - Occupational Stress Questionnaire    Feeling of Stress : Not at all  Social Connections: Socially Isolated (05/25/2023)   Social Connection and Isolation Panel [NHANES]    Frequency of Communication with Friends and Family: More than three times a week    Frequency of Social Gatherings with Friends and Family: More than three times a week    Attends Religious Services: Never    Database administrator or Organizations: No    Attends Engineer, structural: Never    Marital Status: Never married    Tobacco Counseling Counseling given: Not Answered    Clinical Intake:  Pre-visit preparation completed: Yes  Pain : No/denies pain     BMI - recorded: 35.73 Nutritional Status: BMI > 30  Obese Nutritional Risks: None Diabetes: No  No results found for: "HGBA1C"   How often do you need to have someone help you when you read instructions, pamphlets, or other written materials from your doctor or pharmacy?: 1 - Never  Interpreter Needed?: No  Information entered by :: Farris Hong LPN   Activities of Daily Living     05/25/2023    2:21 PM  In your present state of health, do you have any difficulty performing the following activities:  Hearing? 0  Vision? 0  Difficulty concentrating or making decisions? 0  Walking or climbing stairs? 0  Dressing or bathing? 0  Doing errands, shopping? 0  Preparing Food and eating ? N  Using the Toilet? N  In the past six months, have you accidently leaked urine? N  Do you have problems with loss of bowel control? N  Managing your Medications? N  Managing your  Finances? N  Housekeeping or managing your Housekeeping? N    Patient Care Team: Allwardt, Alyssa M, PA-C as PCP - General (Physician Assistant)  Indicate any recent Medical Services you may have received from other than [facility] providers in the past year (date may be approximate).     Assessment:    This is a routine wellness examination for Tyler Mata.  Hearing/Vision screen Hearing Screening - Comments:: Denies hearing difficulties   Vision Screening - Comments:: Wears rx glasses - up to date with routine eye exams with  [facility]   Goals Addressed               This Visit's Progress     Remain active (pt-stated)  Depression Screen     05/25/2023    2:20 PM 05/19/2022    2:38 PM 05/10/2021    2:20 PM  PHQ 2/9 Scores  PHQ - 2 Score 0 0 0    Fall Risk     05/25/2023    2:21 PM 05/19/2022   12:11 PM 05/10/2021    2:21 PM  Fall Risk   Falls in the past year? 0 0 0  Number falls in past yr: 0  0  Injury with Fall? 0  0  Risk for fall due to : No Fall Risks Impaired vision   Follow up Falls prevention discussed;Falls evaluation completed Falls prevention discussed Falls prevention discussed    MEDICARE RISK AT HOME:  Medicare Risk at Home Any stairs in or around the home?: Yes If so, are there any without handrails?: No Home free of loose throw rugs in walkways, pet beds, electrical cords, etc?: Yes Adequate lighting in your home to reduce risk of falls?: Yes Life alert?: No Use of a cane, walker or w/c?: No Grab bars in the bathroom?: No Shower chair or bench in shower?: No Elevated toilet seat or a handicapped toilet?: No  TIMED UP AND GO:  Was the test performed?  No  Cognitive Function: 6CIT completed        05/25/2023    2:22 PM 05/19/2022    2:40 PM 05/10/2021    2:22 PM  6CIT Screen  What Year? 0 points 0 points 0 points  What month? 0 points 0 points 0 points  What time? 0 points 0 points 0 points  Count back from 20 0 points 0 points 0  points  Months in reverse 0 points 0 points 0 points  Repeat phrase 0 points 0 points 0 points  Total Score 0 points 0 points 0 points    Immunizations  There is no immunization history on file for this patient.  Screening Tests Health Maintenance  Topic Date Due   Hepatitis C Screening  Never done   DTaP/Tdap/Td (1 - Tdap) Never done   Colonoscopy  Never done   COVID-19 Vaccine (1 - 2024-25 season) Never done   INFLUENZA VACCINE  08/07/2023   Medicare Annual Wellness (AWV)  05/24/2024   HIV Screening  Completed   HPV VACCINES  Aged Out   Meningococcal B Vaccine  Aged Out    Health Maintenance  Health Maintenance Due  Topic Date Due   Hepatitis C Screening  Never done   DTaP/Tdap/Td (1 - Tdap) Never done   Colonoscopy  Never done   COVID-19 Vaccine (1 - 2024-25 season) Never done   Health Maintenance Items Addressed: Referral sent to GI for colonoscopy  Additional Screening:  Vision Screening: Recommended annual ophthalmology exams for early detection of glaucoma and other disorders of the eye.  Dental Screening: Recommended annual dental exams for proper oral hygiene  Community Resource Referral / Chronic Care Management: CRR required this visit?  No   CCM required this visit?  No   Plan:    I have personally reviewed and noted the following in the patient's chart:   Medical and social history Use of alcohol, tobacco or illicit drugs  Current medications and supplements including opioid prescriptions. Patient is not currently taking opioid prescriptions. Functional ability and status Nutritional status Physical activity Advanced directives List of other physicians Hospitalizations, surgeries, and ER visits in previous 12 months Vitals Screenings to include cognitive, depression, and falls Referrals and appointments  In addition,  I have reviewed and discussed with patient certain preventive protocols, quality metrics, and best practice  recommendations. A written personalized care plan for preventive services as well as general preventive health recommendations were provided to patient.   Dewayne Ford, LPN   09/04/5619   After Visit Summary: (MyChart) Due to this being a telephonic visit, the after visit summary with patients personalized plan was offered to patient via MyChart   Notes: Nothing significant to report at this time.

## 2023-08-12 IMAGING — DX DG CHEST 2V
2 series · 2 of 2 positions shown · non-contrast
Comparison: CT Chest 01/08/2018. Chest x-ray dated January 08, 2018.

CLINICAL DATA: Patient complains of chronic shortness of breath and
a cough. Patient is a heavy smoker.

EXAM:
CHEST - 2 VIEW

[chest pa]
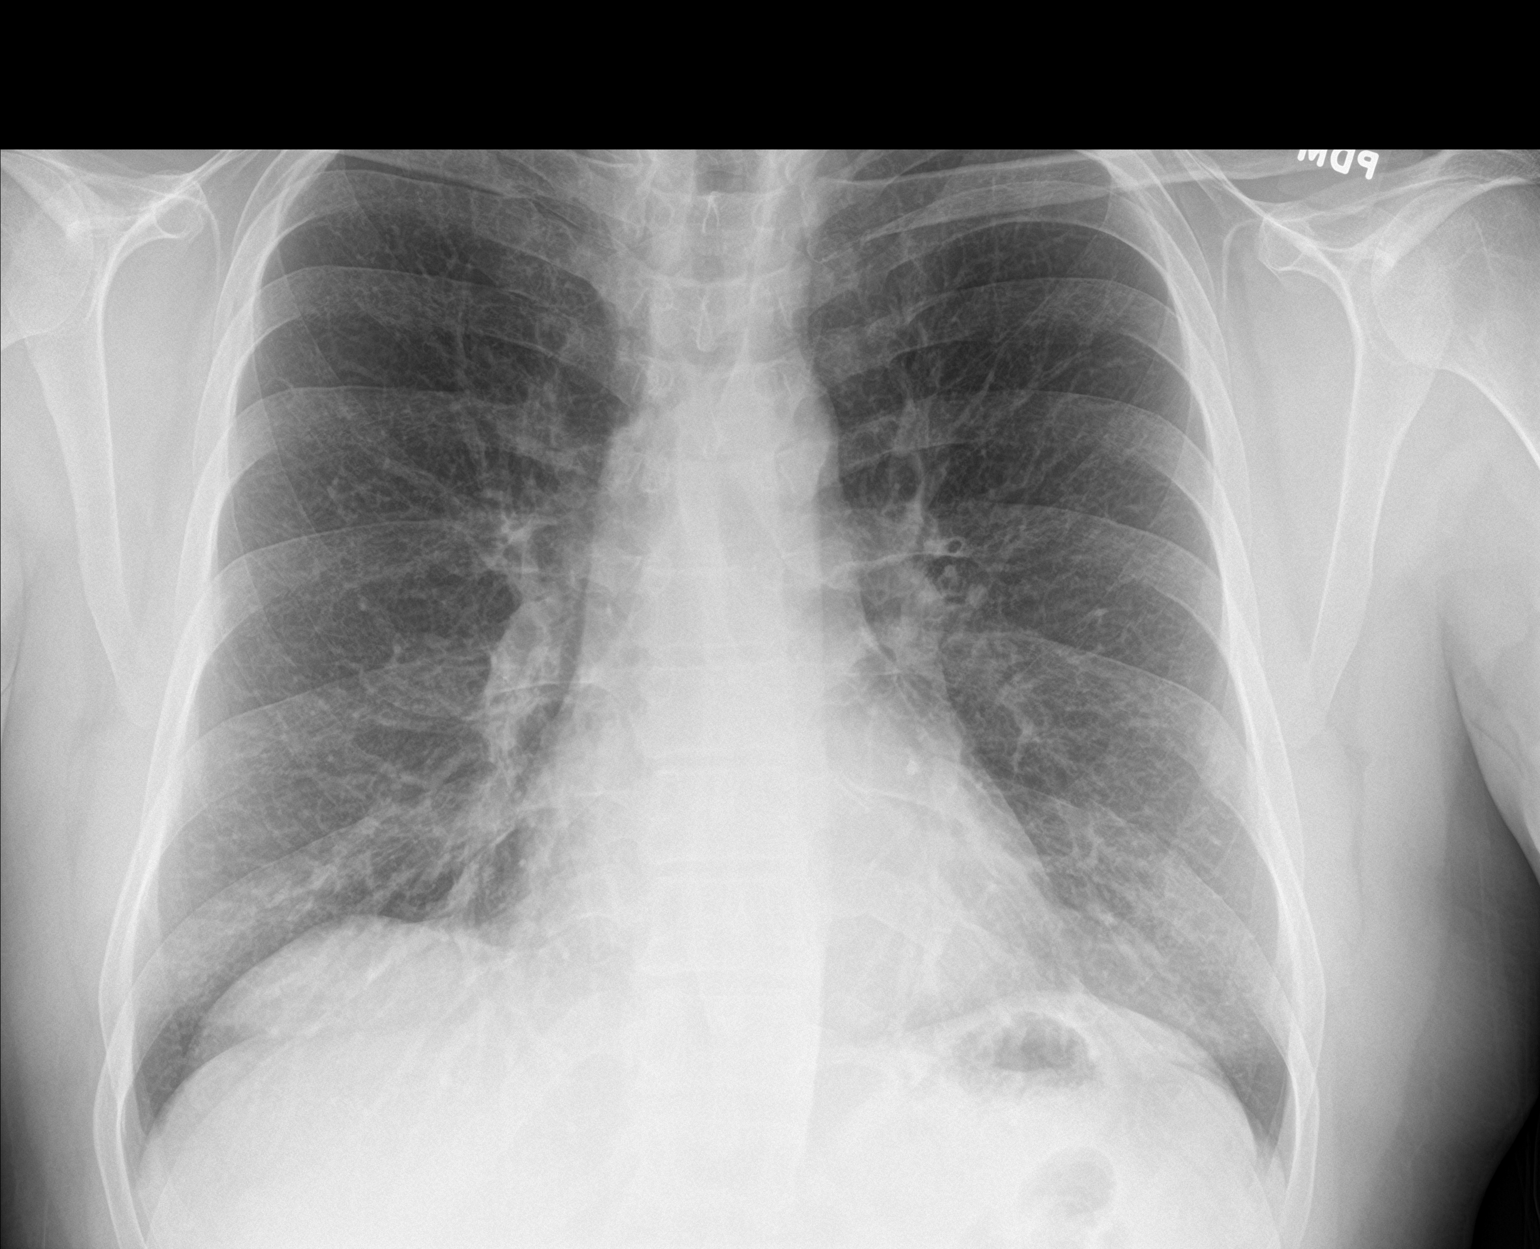

[chest lat]
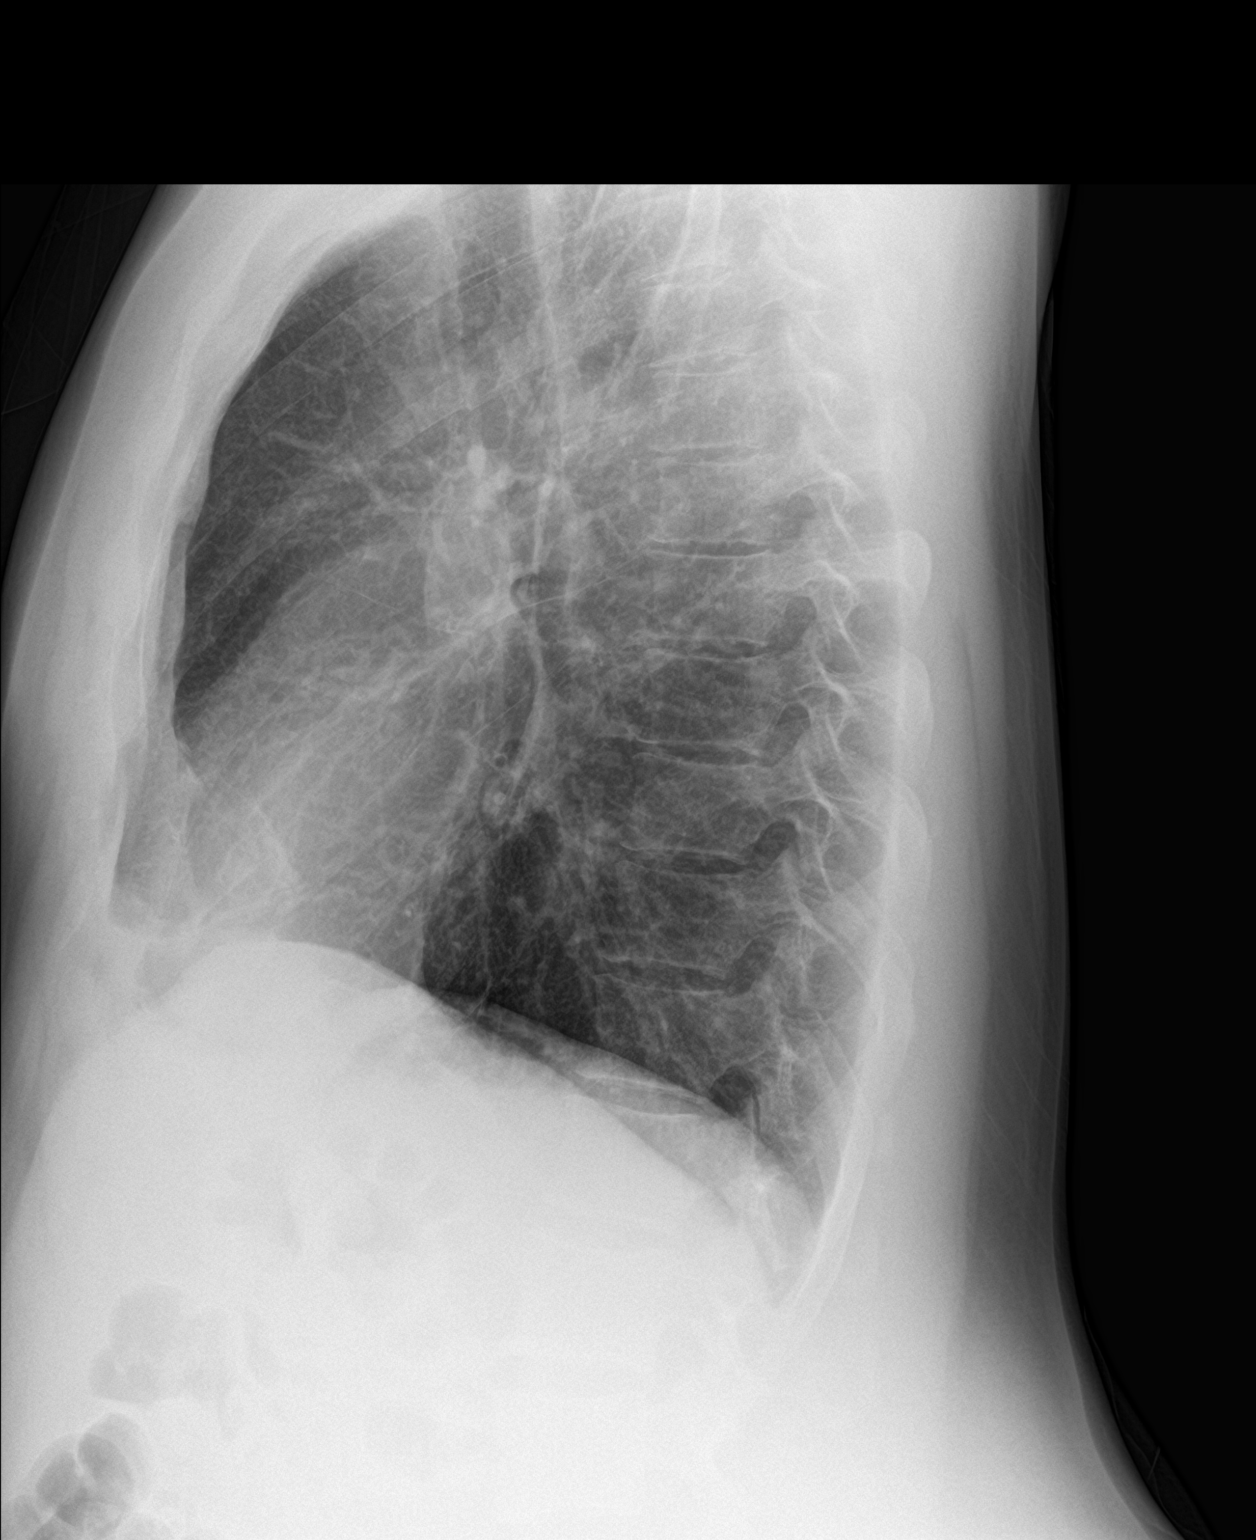

[2 of 2 positions shown; findings below may reference images not displayed]

FINDINGS: The heart size and mediastinal contours are within normal limits.
Normal pulmonary vascularity. Mildly coarsened interstitial
markings, likely smoking-related. No focal consolidation, pleural
effusion, or pneumothorax. No acute osseous abnormality.
IMPRESSION: No active cardiopulmonary disease.

## 2023-08-13 IMAGING — US US ABDOMEN LIMITED
1 series · 14 of 25 positions shown · non-contrast
Comparison: None

CLINICAL DATA: Right liver lesion seen on the CT of 9797.

EXAM:
ULTRASOUND ABDOMEN LIMITED RIGHT UPPER QUADRANT

[Series 1: us abdomen limited · 0.20mm/px · 14 of 64 slices shown]
[im 1/64]
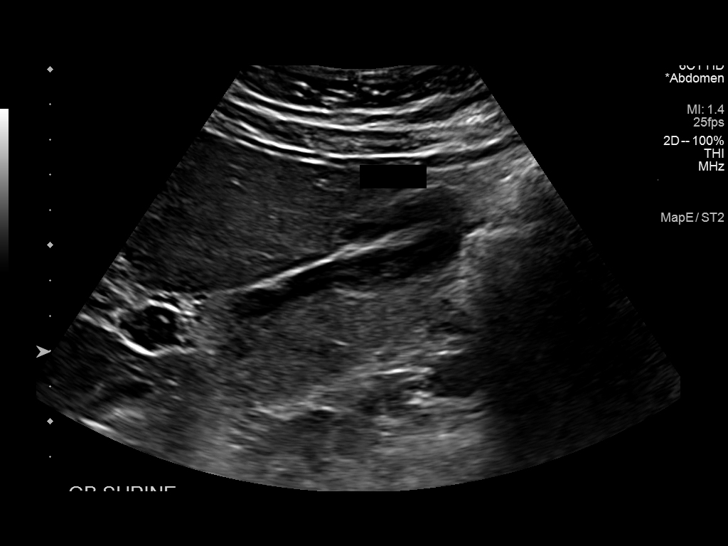
[im 6/64]
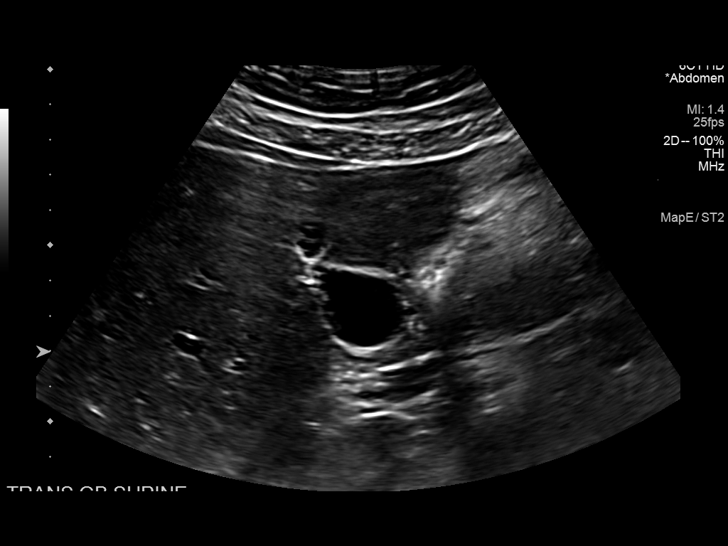
[im 11/64]
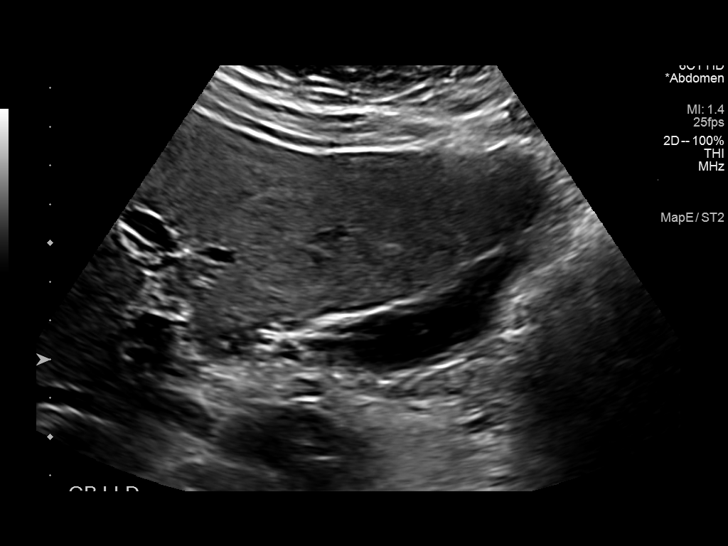
[im 16/64]
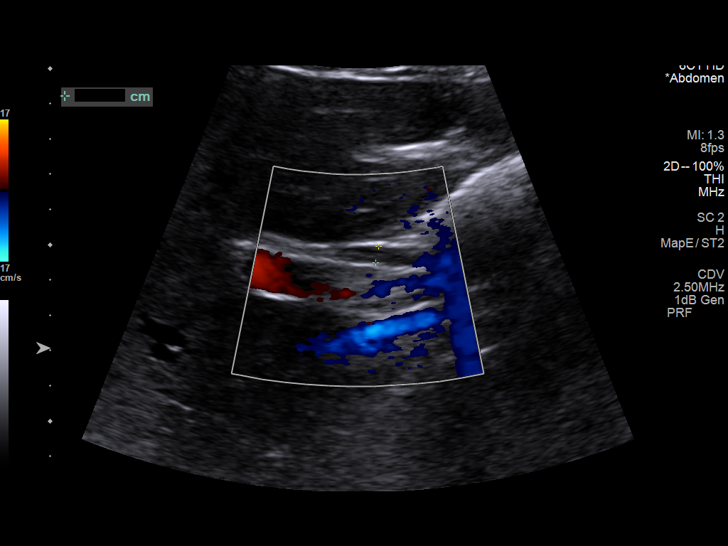
[im 22/64]
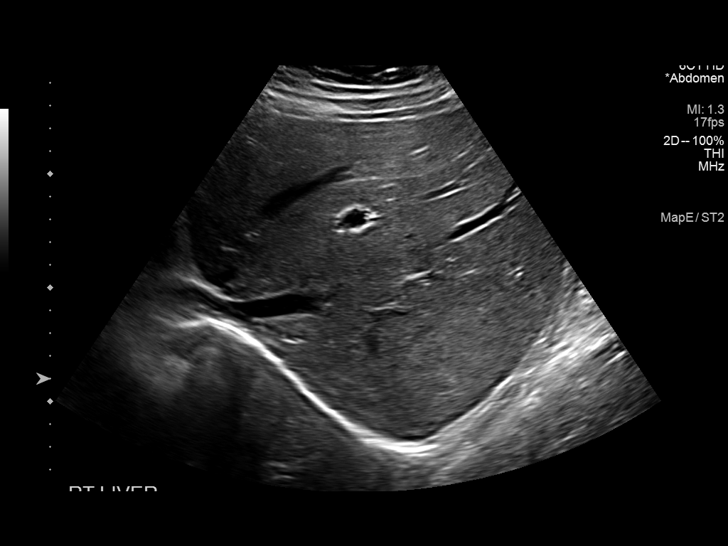
[im 24/64]
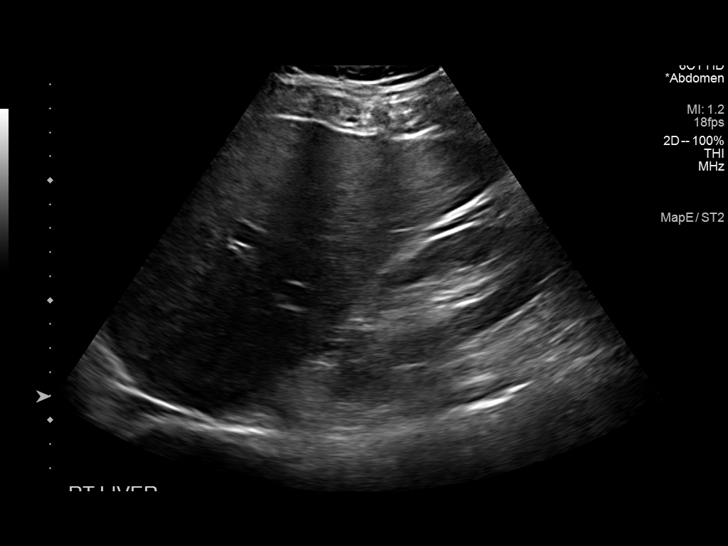
[im 29/64]
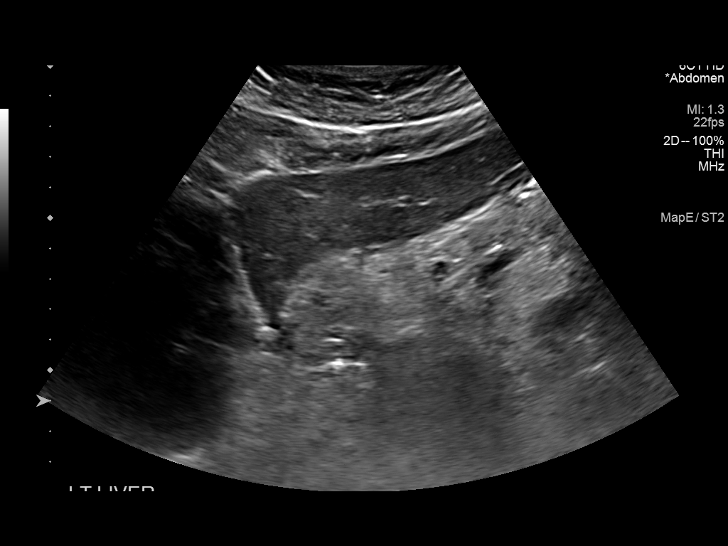
[im 35/64]
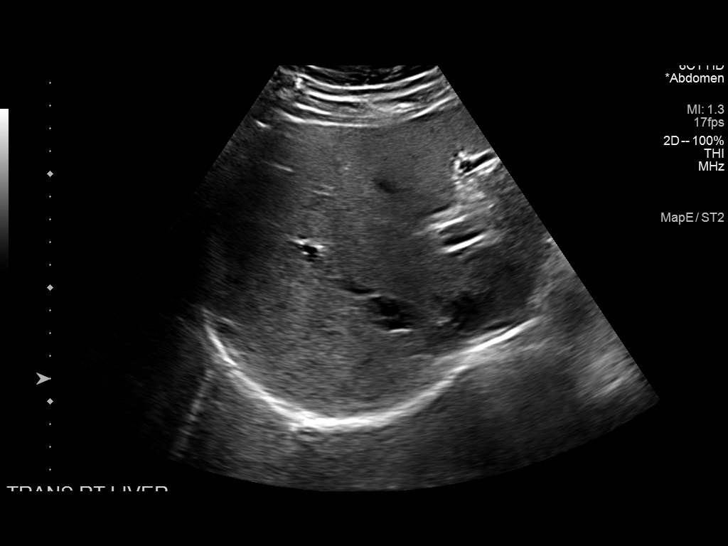
[im 40/64]
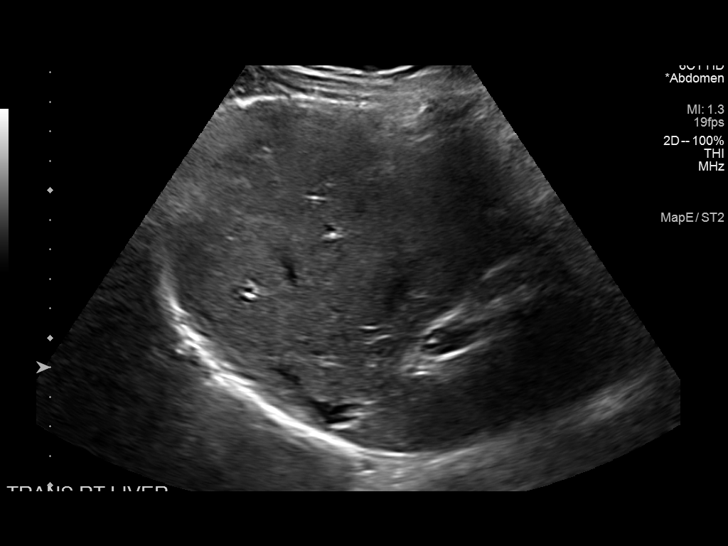
[im 43/64]
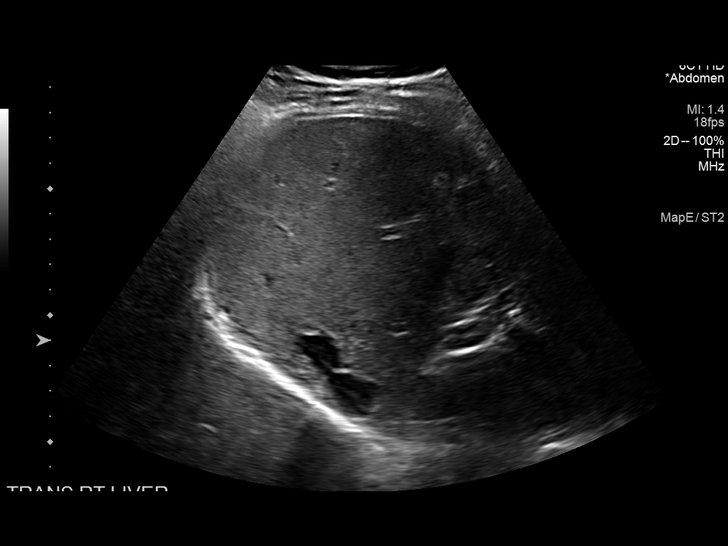
[im 48/64]
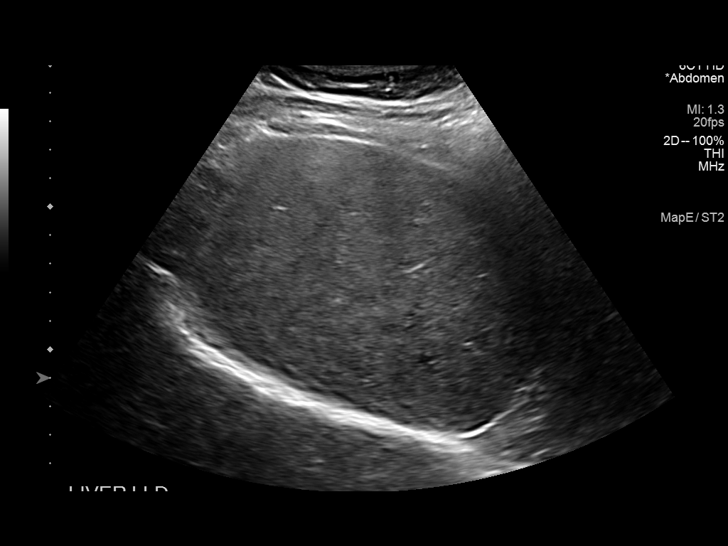
[im 53/64]
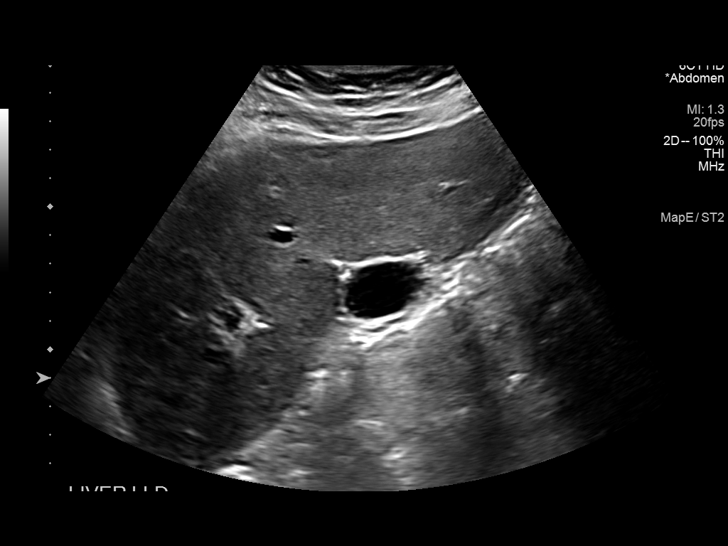
[im 58/64]
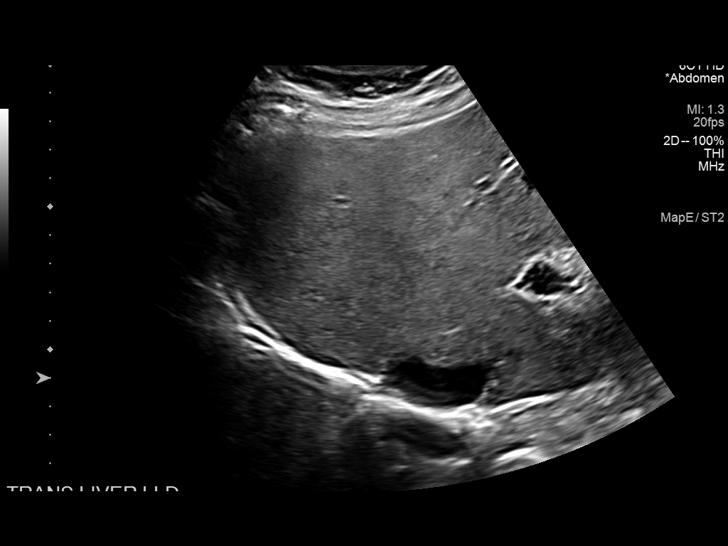
[im 64/64]
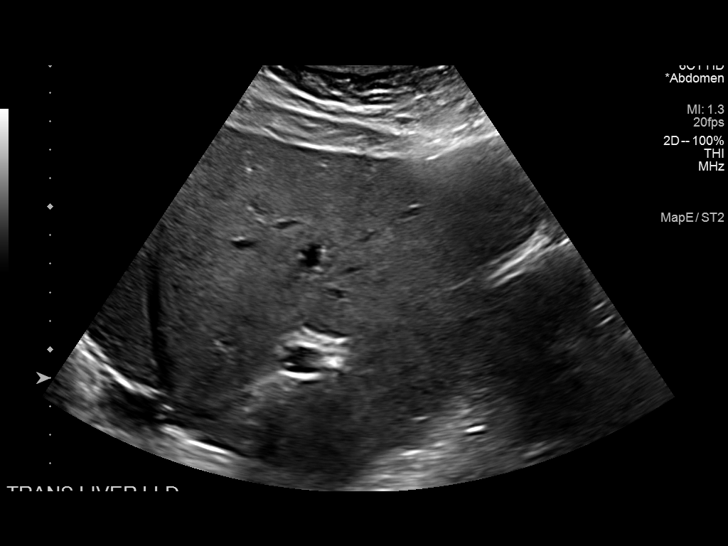

[14 of 25 positions shown; findings below may reference images not displayed]

FINDINGS: Gallbladder:

No gallstones or wall thickening visualized. No sonographic Murphy
sign noted by sonographer.

Common bile duct:

Diameter: 5 mm

Liver:

No focal lesion identified. Within normal limits in parenchymal
echogenicity. Portal vein is patent on color Doppler imaging with
normal direction of blood flow towards the liver.

Other: None.
IMPRESSION: Unremarkable right upper quadrant ultrasound. No discrete liver
lesion identified.

## 2024-01-15 ENCOUNTER — Ambulatory Visit: Payer: Self-pay

## 2024-01-15 NOTE — Telephone Encounter (Signed)
 FYI on pt; pt scheduled for Monday OV

## 2024-01-15 NOTE — Telephone Encounter (Signed)
 FYI Only or Action Required?: FYI only for provider: appointment scheduled on 1/12.  Patient was last seen in primary care on 03/22/2021 by Allwardt, Mardy HERO, PA-C.  Called Nurse Triage reporting Hypertension.  Symptoms began recently increasing diastolic BP, 100 yesterday.  Interventions attempted: Nothing.  Symptoms are: gradually worsening.  Triage Disposition: See PCP When Office is Open (Within 3 Days)  Patient/caregiver understands and will follow disposition?: Yes    Copied from CRM #[id number]. Topic: Clinical - Red Word Triage >> Jan 15, 2024  1:40 PM Deleta RAMAN wrote: Red Word that prompted transfer to Nurse Triage:  High blood pressure    Reason for Disposition  Systolic BP >= 160 OR Diastolic >= 100  Answer Assessment - Initial Assessment Questions ONSET: When did you take your blood pressure?     Couple months ago DBP was getting into 90s, recently at 100.    Was exactly 100 (diastolic blood pressure/ DBP) yesterday.   HOW: How did you take your blood pressure? (e.g., automatic home BP monitor, visiting nurse)     BPs are measured by another specialist who advised being seen by PCP.   HISTORY: Do you have a history of high blood pressure?     Denies   OTHER SYMPTOMS: Do you have any symptoms? (e.g., blurred vision, chest pain, difficulty breathing, headache, weakness)     As far as can tell everything is normal. Denies listed symptoms and denies any numbness or tingling or trouble with bladder.  Protocols used: Blood Pressure - High-A-AH

## 2024-01-18 ENCOUNTER — Ambulatory Visit: Admitting: Family

## 2024-01-18 VITALS — BP 131/87 | HR 68 | Temp 98.6°F | Ht 68.0 in | Wt 245.4 lb

## 2024-01-18 DIAGNOSIS — H6123 Impacted cerumen, bilateral: Secondary | ICD-10-CM | POA: Diagnosis not present

## 2024-01-18 DIAGNOSIS — B351 Tinea unguium: Secondary | ICD-10-CM | POA: Diagnosis not present

## 2024-01-18 DIAGNOSIS — I10 Essential (primary) hypertension: Secondary | ICD-10-CM | POA: Diagnosis not present

## 2024-01-18 NOTE — Progress Notes (Signed)
 "  Patient ID: Tyler Mata, male    DOB: Dec 05, 1975, 49 y.o.   MRN: [id number]  Chief Complaint  Patient presents with   Hypertension    Pt c/o high dystolic bp readings, present for 4 months.    Nail Problem    Pt c/o left big toe nail fungus, present for 1 year. Has tried vick vapor rub.    Ear Fullness    Pt c/o bilateral ear fullness.   Discussed the use of AI scribe software for clinical note transcription with the patient, who gave verbal consent to proceed.  History of Present Illness Tyler Mata is a 49 year old male who presents with ear fullness and elevated blood pressure.  He has a persistent sensation of fullness and pressure in both ears, described as similar to descending from the mountains. Cleaning his ears has not helped. He denies ear pain. He has had repeatedly elevated blood pressure for about three months, with diastolic readings around 100 mmHg first noted at his psychiatrist's office. His blood pressure is checked monthly there, and it has stayed elevated. He does not monitor blood pressure at home and does not own a blood pressure cuff. He has gained about ten pounds over six months and is trying to improve blood pressure and weight with diet and exercise. He previously tried a weekday vegan diet but found it high in sodium and has switched to frozen vegetables from canned to lower sodium. He drinks caffeine-free diet soda and low-sodium drink mixes. He [facility] a couple of times per week for about half a mile each time. He has a chronic problem with his big toenail, described as white and dead, present for a long time. He is unsure of the cause. He does not use over-the-counter NSAIDs and avoids high-sugar sports drinks and similar beverages.  Assessment & Plan Bilateral impacted cerumen Causing fullness and pressure sensation in the ears, affecting hearing. Unsuccessful disimpaction attempted. No pain reported. - Verbal consent received to perform  bilateral ear lavage via Hydrogen peroxide/water mix solution. Curette used to try and remove visible cerumen. Pt tolerated well, but we were unable to evacuate any of the cerumen. - Referred to ENT for evaluation and management.  Essential hypertension BP in office wnl today, reported elevated diastolic blood pressure at 100 mmHg. Systolic pressure well-controlled. Weight gain of 10 pounds and diet may contribute. No home monitoring. Discussed sodium intake and exercise. - Advised dietary sodium reduction, avoiding canned foods unless states no salt added, frozen dinners (veges ok) and takeout/restaurant foods. - Encouraged increased physical activity, such as brisk walking as many days as able. - Scheduled follow-up in four weeks to recheck blood pressure if still running high at [facility]. - Obtain release of records from psychiatrist for blood pressure monitoring & office notes.  Onychomycosis White and dead appearance of the left great toenail. No pain reported. - Referred to podiatrist for evaluation and potential removal.  Subjective:    Outpatient Medications Prior to Visit  Medication Sig Dispense Refill   benztropine (COGENTIN) 0.5 MG tablet Take 0.5 mg by mouth daily.     fluPHENAZine (PROLIXIN) 1 MG tablet Take 1 mg by mouth at bedtime.     paliperidone (INVEGA SUSTENNA) 234 MG/1.5ML SUSY injection 156 mg every 30 (thirty) days.     SYMBICORT  80-4.5 MCG/ACT inhaler INHALE 2 PUFFS INTO THE LUNGS IN THE MORNING AND AT BEDTIME. RINSE MOUTH AFTER USE. 10.2 each 12   traZODone (DESYREL)  150 MG tablet Take 150 mg by mouth at bedtime.     traZODone (DESYREL) 50 MG tablet Take 50 mg by mouth at bedtime.     ibuprofen  (ADVIL ) 800 MG tablet Take 1 tablet (800 mg total) by mouth every 8 (eight) hours as needed. 30 tablet 0   No facility-administered medications prior to visit.   Past Medical History:  Diagnosis Date   Pneumonia    Schizophrenia [facility])    Past Surgical History:   Procedure Laterality Date   NO PAST SURGERIES     TOOTH EXTRACTION N/A 06/15/2020   Procedure: DENTAL RESTORATION/EXTRACTIONS;  Surgeon: Sheryle Hamilton, DMD;  Location: [facility] OR;  Service: Oral Surgery;  Laterality: N/A;   Allergies[1]    Objective:    Physical Exam Vitals and nursing note reviewed.  Constitutional:      General: He is not in acute distress.    Appearance: Normal appearance. He is obese.  HENT:     Head: Normocephalic.  Cardiovascular:     Rate and Rhythm: Normal rate and regular rhythm.  Pulmonary:     Effort: Pulmonary effort is normal.     Breath sounds: Normal breath sounds.  Musculoskeletal:        General: Normal range of motion.     Cervical back: Normal range of motion.  Skin:    General: Skin is warm and dry.  Neurological:     Mental Status: He is alert and oriented to person, place, and time.  Psychiatric:        Mood and Affect: Mood normal.    BP 131/87 (BP Location: Right Arm, Patient Position: Sitting, Cuff Size: Large)   Pulse 68   Temp 98.6 F (37 C) (Temporal)   Ht 5' 8 (1.727 m)   Wt 245 lb 6.4 oz (111.3 kg)   SpO2 95%   BMI 37.31 kg/m  Wt Readings from Last 3 Encounters:  01/18/24 245 lb 6.4 oz (111.3 kg)  05/25/23 235 lb (106.6 kg)  05/19/22 240 lb (108.9 kg)      Xian Alves, NP     [1] No Known Allergies  "

## 2024-01-18 NOTE — Patient Instructions (Signed)
 It was very nice to see you today!   I have sent 2 referrals for you and they are listed below. They will call you to schedule. See the info sheet attached regarding tips to lower your blood pressure. Drink more water, do more cardio exercise, avoid sodium in your diet and work on losing weight!  If your blood pressure is still high at [facility], schedule a follow up visit with the office.     PLEASE NOTE:  If you had any lab tests please let us  know if you have not heard back within a few days. You may see your results on MyChart before we have a chance to review them but we will give you a call once they are reviewed by us . If we ordered any referrals today, please let us  know if you have not heard from their office within the next week.

## 2024-01-27 ENCOUNTER — Encounter: Payer: Self-pay | Admitting: Podiatry

## 2024-01-27 ENCOUNTER — Ambulatory Visit (INDEPENDENT_AMBULATORY_CARE_PROVIDER_SITE_OTHER): Admitting: Podiatry

## 2024-01-27 VITALS — Ht 68.0 in | Wt 245.0 lb

## 2024-01-27 DIAGNOSIS — B351 Tinea unguium: Secondary | ICD-10-CM | POA: Diagnosis not present

## 2024-02-02 NOTE — Progress Notes (Signed)
" ° °  Chief Complaint  Patient presents with   Nail Problem    Pt is here due to left great toenail fungus.    Subjective: 49 y.o. male presenting today for evaluation of discoloration with thickening to the left great toenail  Past Medical History:  Diagnosis Date   Pneumonia    Schizophrenia [facility])     Past Surgical History:  Procedure Laterality Date   NO PAST SURGERIES     TOOTH EXTRACTION N/A 06/15/2020   Procedure: DENTAL RESTORATION/EXTRACTIONS;  Surgeon: Sheryle Hamilton, DMD;  Location: [facility] OR;  Service: Oral Surgery;  Laterality: N/A;    Allergies[1]   LT great toenail 01/27/2024  Objective: Physical Exam General: The patient is alert and oriented x3 in no acute distress.  Dermatology: Hyperkeratotic, discolored, thickened, onychodystrophy noted. Skin is warm, dry and supple bilateral lower extremities. Negative for open lesions or macerations.  Vascular: Palpable pedal pulses bilaterally. No edema or erythema noted. Capillary refill within normal limits.  Neurological: Grossly intact via light touch  Musculoskeletal Exam: No pedal deformity noted  Assessment: #1 Onychomycosis of toenail left hallux nail plate  Plan of Care:  -Patient was evaluated. -Today we discussed different treatment options including oral, topical, and laser antifungal treatment modalities.  We discussed their efficacies and side effects.  Patient opts for oral antifungal treatment modality -For now recommend conservative treatment.  Recommend daily OTC topical antifungal medication as the nail grows out -Maintain good foot hygiene.  Recommend good supportive shoes and sneakers -Return to clinic PRN  Thresa EMERSON Sar, DPM [facility]  Dr. Thresa EMERSON Sar, DPM    [address] [place], [place] [address]                Office [phone]  Fax [phone]        [1] No Known Allergies  "

## 2024-02-03 ENCOUNTER — Encounter (INDEPENDENT_AMBULATORY_CARE_PROVIDER_SITE_OTHER): Payer: Self-pay | Admitting: Physician Assistant

## 2024-02-03 ENCOUNTER — Ambulatory Visit (INDEPENDENT_AMBULATORY_CARE_PROVIDER_SITE_OTHER): Admitting: Physician Assistant

## 2024-02-03 VITALS — BP 126/80 | HR 74 | Ht 68.0 in | Wt 250.0 lb

## 2024-02-03 DIAGNOSIS — H6123 Impacted cerumen, bilateral: Secondary | ICD-10-CM

## 2024-02-03 NOTE — Progress Notes (Signed)
 Dear Dr. Lucius, Here is my assessment for our mutual patient, Tyler Mata. Thank you for allowing me the opportunity to care for your patient. Please do not hesitate to contact me should you have any other questions. Sincerely, Chyrl Cohen PA-C  Otolaryngology Clinic Note Referring provider: Dr. Lucius HPI:  Tyler Mata is a 49 y.o. male kindly referred by Dr. Lucius   Discussed the use of AI scribe software for clinical note transcription with the patient, who gave verbal consent to proceed.  History of Present Illness   Tyler Mata is a 49 year old male with recurrent bilateral cerumen impaction who presents for evaluation and management of impacted cerumen.  He was referred after his primary care provider noted cerumen impaction that was difficult to remove.  He reports normal hearing when his ears are not obstructed by cerumen. He denies otalgia or otorrhea. He describes inconsistent ear hygiene practices and has previously experienced spontaneous expulsion of large cerumen clumps.  He recently underwent an attempted cerumen removal by another provider using a glass or plastic curette, which resulted in discomfort, particularly when the instrument contacted a sensitive area, and subsequent minor bleeding.           Independent Review of Additional Tests or Records:  NONE   PMH/Meds/All/SocHx/FamHx/ROS:   Past Medical History:  Diagnosis Date   Pneumonia    Schizophrenia (HCC)      Past Surgical History:  Procedure Laterality Date   NO PAST SURGERIES     TOOTH EXTRACTION N/A 06/15/2020   Procedure: DENTAL RESTORATION/EXTRACTIONS;  Surgeon: Sheryle Hamilton, DMD;  Location: [facility] OR;  Service: Oral Surgery;  Laterality: N/A;    History reviewed. No pertinent family history.   Social Connections: Socially Isolated (05/25/2023)   Social Connection and Isolation Panel    Frequency of Communication with Friends and Family: More than three times a week    Frequency  of Social Gatherings with Friends and Family: More than three times a week    Attends Religious Services: Never    Database Administrator or Organizations: No    Attends Engineer, Structural: Never    Marital Status: Never married     Current Medications[1]   Physical Exam:   BP 126/80   Pulse 74   Ht 5' 8 (1.727 m)   Wt 250 lb (113.4 kg)   SpO2 95%   BMI 38.01 kg/m   Pertinent Findings  CN II-XII grossly intact Bilateral EAC with cerumen impaction and abrasions to canal  Anterior rhinoscopy: Septum midline; bilateral inferior turbinates with no hypertrophy No lesions of oral cavity/oropharynx No obviously palpable neck masses/lymphadenopathy/thyromegaly No respiratory distress or stridor    Seprately Identifiable Procedures:  Procedure: Bilateral ear microscopy and cerumen removal using microscope (CPT 69210) - Mod 50 Pre-procedure diagnosis: bilateral cerumen impaction external auditory canals Post-procedure diagnosis: same Indication: bilateral cerumen impaction; given patient's otologic complaints and history as well as for improved and comprehensive examination of external ear and tympanic membrane, bilateral otologic examination using microscope was performed and impacted cerumen removed  Procedure: Patient was placed semi-recumbent. Both ear canals were examined using the microscope with findings above. Cerumen removed from bilateral external auditory canals using suction and currette with improvement in EAC examination and patency. Left: EAC was patent. TM was intact . Middle ear was aerated. Drainage: none Right: EAC was patent. TM was intact . Middle ear was aerated . Drainage: none Patient tolerated the procedure well.   Impression & Plans:  Tyler Mata is a 49 y.o. male with the following   Assessment and Plan    Impacted cerumen  - Performed cerumen removal in clinic, hearing back to normal           - f/u PRN   Thank you for allowing  me the opportunity to care for your patient. Please do not hesitate to contact me should you have any other questions.  Sincerely, Chyrl Cohen PA-C [facility] Phone: [phone] Fax: [phone]  02/03/2024, 10:23 AM        [1]  Current Outpatient Medications:    benztropine (COGENTIN) 0.5 MG tablet, Take 0.5 mg by mouth daily., Disp: , Rfl:    fluPHENAZine (PROLIXIN) 1 MG tablet, Take 1 mg by mouth at bedtime., Disp: , Rfl:    paliperidone (INVEGA SUSTENNA) 234 MG/1.5ML SUSY injection, 156 mg every 30 (thirty) days., Disp: , Rfl:    traZODone (DESYREL) 150 MG tablet, Take 150 mg by mouth at bedtime., Disp: , Rfl:    traZODone (DESYREL) 50 MG tablet, Take 50 mg by mouth at bedtime., Disp: , Rfl:

## 2024-02-18 ENCOUNTER — Ambulatory Visit: Admitting: Physician Assistant

## 2024-05-30 ENCOUNTER — Ambulatory Visit: Admitting: Physician Assistant

## 2024-05-30 ENCOUNTER — Ambulatory Visit
